# Patient Record
Sex: Male | Born: 1985 | Race: White | Hispanic: No | Smoking: Never smoker
Health system: Southern US, Community
[De-identification: ages and names within clinical notes are randomized; demographics above are authoritative.]

## PROBLEM LIST (undated history)

## (undated) DIAGNOSIS — E785 Hyperlipidemia, unspecified: Secondary | ICD-10-CM

## (undated) DIAGNOSIS — Z87898 Personal history of other specified conditions: Secondary | ICD-10-CM

## (undated) DIAGNOSIS — T7840XA Allergy, unspecified, initial encounter: Secondary | ICD-10-CM

## (undated) DIAGNOSIS — I10 Essential (primary) hypertension: Secondary | ICD-10-CM

## (undated) DIAGNOSIS — F319 Bipolar disorder, unspecified: Secondary | ICD-10-CM

## (undated) DIAGNOSIS — D649 Anemia, unspecified: Secondary | ICD-10-CM

## (undated) DIAGNOSIS — G40909 Epilepsy, unspecified, not intractable, without status epilepticus: Secondary | ICD-10-CM

## (undated) HISTORY — DX: Bipolar disorder, unspecified: F31.9

## (undated) HISTORY — DX: Personal history of other specified conditions: Z87.898

## (undated) HISTORY — PX: OTHER SURGICAL HISTORY: SHX169

## (undated) HISTORY — DX: Allergy, unspecified, initial encounter: T78.40XA

## (undated) HISTORY — DX: Anemia, unspecified: D64.9

## (undated) HISTORY — DX: Epilepsy, unspecified, not intractable, without status epilepticus: G40.909

## (undated) HISTORY — DX: Hyperlipidemia, unspecified: E78.5

## (undated) HISTORY — DX: Essential (primary) hypertension: I10

---
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
## 2010-01-02 ENCOUNTER — Emergency Department (HOSPITAL_COMMUNITY): Admission: EM | Admit: 2010-01-02 | Discharge: 2010-01-02 | Payer: Self-pay | Admitting: Emergency Medicine

## 2010-02-10 ENCOUNTER — Emergency Department (HOSPITAL_COMMUNITY): Admission: EM | Admit: 2010-02-10 | Discharge: 2010-02-10 | Payer: Self-pay | Admitting: Emergency Medicine

## 2010-10-04 ENCOUNTER — Emergency Department (HOSPITAL_COMMUNITY)
Admission: EM | Admit: 2010-10-04 | Discharge: 2010-10-04 | Payer: Self-pay | Source: Home / Self Care | Admitting: Emergency Medicine

## 2010-11-06 ENCOUNTER — Emergency Department (HOSPITAL_COMMUNITY)
Admission: EM | Admit: 2010-11-06 | Discharge: 2010-11-06 | Payer: Self-pay | Source: Home / Self Care | Admitting: Emergency Medicine

## 2013-02-18 ENCOUNTER — Encounter: Payer: Self-pay | Admitting: Family Medicine

## 2013-02-18 DIAGNOSIS — T7840XA Allergy, unspecified, initial encounter: Secondary | ICD-10-CM | POA: Insufficient documentation

## 2013-02-18 DIAGNOSIS — F319 Bipolar disorder, unspecified: Secondary | ICD-10-CM | POA: Insufficient documentation

## 2013-02-18 DIAGNOSIS — I10 Essential (primary) hypertension: Secondary | ICD-10-CM | POA: Insufficient documentation

## 2013-02-18 DIAGNOSIS — D649 Anemia, unspecified: Secondary | ICD-10-CM | POA: Insufficient documentation

## 2013-02-19 ENCOUNTER — Encounter: Payer: Self-pay | Admitting: Physician Assistant

## 2013-02-19 ENCOUNTER — Ambulatory Visit (INDEPENDENT_AMBULATORY_CARE_PROVIDER_SITE_OTHER): Payer: Medicaid Other | Admitting: Physician Assistant

## 2013-02-19 VITALS — BP 126/90 | HR 80 | Temp 98.4°F | Resp 18 | Ht 74.0 in | Wt 218.0 lb

## 2013-02-19 DIAGNOSIS — F319 Bipolar disorder, unspecified: Secondary | ICD-10-CM

## 2013-02-19 DIAGNOSIS — L02212 Cutaneous abscess of back [any part, except buttock]: Secondary | ICD-10-CM

## 2013-02-19 DIAGNOSIS — L03319 Cellulitis of trunk, unspecified: Secondary | ICD-10-CM

## 2013-02-19 MED ORDER — DOXYCYCLINE HYCLATE 100 MG PO TABS: 100.0000 mg | ORAL_TABLET | Freq: Two times a day (BID) | ORAL | Status: DC

## 2013-02-19 MED ORDER — QUETIAPINE FUMARATE 50 MG PO TABS: 50.0000 mg | ORAL_TABLET | Freq: Two times a day (BID) | ORAL | Status: DC

## 2013-02-19 MED ORDER — SULFAMETHOXAZOLE-TRIMETHOPRIM 800-160 MG PO TABS: 1.0000 | ORAL_TABLET | Freq: Two times a day (BID) | ORAL | Status: DC

## 2013-02-19 NOTE — Progress Notes (Signed)
Patient ID: Samuel Reynolds MRN: [id number], DOB: [birth year], 27 y.o. Date of Encounter: @DATE @  Chief Complaint:  Chief Complaint  Patient presents with  . draining spot at lower back at buttocks crack  . Medication Problem    states off all meds sicne last year because pharmacy says we never refilled?????    HPI: 27 y.o. year old male  presents with:  1- first noticed this spot-top of buttock crease-3 days ago. Noticed some red, yellow drainage.  Has never had abscess on any area of skin before this per pt. No fever/chills.  2- Has been off all meds since 03/2012. Says pharmacy "wouldn't refill"   Bipolar; Says he has noticed big difference since off meds. "Alot of mood swings and very snappy very easily." No suicidal,homicidal ideations.   Past Medical History  Diagnosis Date  . Allergy     seasonal  . Bipolar 1 disorder   . Anemia   . Hypertension      Home Meds: No current outpatient prescriptions on file prior to visit.   No current facility-administered medications on file prior to visit.    Allergies:  Allergies  Allergen Reactions  . Benadryl (Diphenhydramine Hcl)     History   Social History  . Marital Status: Married    Spouse Name: N/A    Number of Children: N/A  . Years of Education: N/A   Occupational History  . Not on file.   Social History Main Topics  . Smoking status: Never Smoker   . Smokeless tobacco: Never Used  . Alcohol Use: No  . Drug Use: No  . Sexually Active: Not on file   Other Topics Concern  . Not on file   Social History Narrative  . No narrative on file    No family history on file.   Review of Systems: Constitutional: negative for chills, fever, night sweats, weight changes, or fatigue  HEENT: negative for vision changes, hearing loss, congestion, rhinorrhea, ST, epistaxis, or sinus pressure Cardiovascular: negative for chest pain or palpitations. No new/increased shortness of breath or dyspnea on  exertion Respiratory: negative for hemoptysis, wheezing, shortness of breath, or cough Abdominal: negative for abdominal pain, nausea, vomiting, diarrhea, or constipation Dermatological: See HPI. O/W negative Neurologic: negative for headache, dizziness, or syncope All other systems reviewed and are otherwise negative with the exception to those above and in the HPI.   Physical Exam: Blood pressure 126/90, pulse 80, temperature 98.4 F (36.9 C), temperature source Oral, resp. rate 18, height 6\' 2"  (1.88 m), weight 218 lb (98.884 kg)., Body mass index is 27.98 kg/(m^2). General: Well developed, well nourished,WM. Appears in no acute distress. Lungs: Clear bilaterally to auscultation without wheezes, rales, or rhonchi. Breathing is unlabored. Heart: RRR with S1 S2. No murmurs, rubs, or gallops. Musculoskeletal:  Strength and tone normal for age. Extremities/Skin: Warm and dry. Low back, at top of crease of buttocks, on right side, there is a 1cm area of erythema. It is open and draining- purulent yellow-green, followed by bloody drainage. Culture obtained. Has already been draining. Now very tiny area of firmness/abscess. Exam not c/w pilonidal cyst Neuro: Alert and oriented X 3. Moves all extremities spontaneously. Gait is normal. CNII-XII grossly in tact. Psych:  Responds to questions appropriately with a normal affect. Stable/Appropriate during visit.     ASSESSMENT AND PLAN:  27 y.o. year old male with  1. Abscess of lower back (not c/w pilonidal cyst) Has already drained. Does not require I&D -  Culture, routine-abscess - sulfamethoxazole-trimethoprim (BACTRIM DS,SEPTRA DS) 800-160 MG per tablet; Take 1 tablet by mouth 2 (two) times daily.  Dispense: 20 tablet; Refill: 0 - doxycycline (VIBRA-TABS) 100 MG tablet; Take 1 tablet (100 mg total) by mouth 2 (two) times daily.  Dispense: 20 tablet; Refill: 0  2. Bipolar disorder, unspecified Explained to pt to never stop his meds again. If  needs refills and pharmacy will not refill, call us. Will restart Seroquel now. He is to schedule F/U OV with Dr Tanya Nones to f/u this in 3 weeks (before med runs out) - QUEtiapine (SEROQUEL) 50 MG tablet; Take 1 tablet (50 mg total) by mouth 2 (two) times daily.  Dispense: 60 tablet; Refill: 0   Signed, [address] [place], [place], [facility] 02/19/2013 11:52 AM

## 2013-02-21 LAB — CULTURE, ROUTINE-ABSCESS
Gram Stain: NONE SEEN
Gram Stain: NONE SEEN
Gram Stain: NONE SEEN

## 2013-03-12 ENCOUNTER — Encounter: Payer: Self-pay | Admitting: Family Medicine

## 2013-03-12 ENCOUNTER — Ambulatory Visit (INDEPENDENT_AMBULATORY_CARE_PROVIDER_SITE_OTHER): Payer: Medicaid Other | Admitting: Family Medicine

## 2013-03-12 VITALS — BP 128/70 | HR 86 | Temp 98.0°F | Resp 16 | Wt 218.0 lb

## 2013-03-12 DIAGNOSIS — F319 Bipolar disorder, unspecified: Secondary | ICD-10-CM

## 2013-03-12 DIAGNOSIS — L0501 Pilonidal cyst with abscess: Secondary | ICD-10-CM

## 2013-03-12 MED ORDER — PANTOPRAZOLE SODIUM 40 MG PO TBEC: 40.0000 mg | DELAYED_RELEASE_TABLET | Freq: Every day | ORAL | Status: DC

## 2013-03-12 MED ORDER — DIVALPROEX SODIUM 500 MG PO DR TAB: 500.0000 mg | DELAYED_RELEASE_TABLET | Freq: Two times a day (BID) | ORAL | Status: DC

## 2013-03-12 NOTE — Progress Notes (Signed)
Subjective:    Patient ID: Samuel Reynolds, male    DOB: 06/10/1986, 27 y.o.   MRN: [id number]  HPI Patient is here for followup of his last office visit. He originally saw Lawson Fiscal for an abscess at the superior aspect of his gluteal cleft. His culture revealed Streptococcus agalactiae and the abscess has since completely resolved on antibiotics. However the location raises concern for possibly a pilonidal cyst. There is no residual abscess or cyst for examination. There is a small 4 mm red macule where the accessory joint was located at the superior aspect of his gluteal cleft. He also was resumed his medicines for bipolar. He previously was well controlled on Depakote 500 mg by mouth twice a day. We originally added Seroquel 50 mg by mouth twice a day for anger control. He's been off medications for over 7 months. He denies any depression, suicidal ideation, or manic symptoms. He does report problems with wall mood swings. He's also having frequent episodes of indigestion. Past Medical History  Diagnosis Date  . Allergy     seasonal  . Bipolar 1 disorder   . Anemia   . Hypertension    Current Outpatient Prescriptions on File Prior to Visit  Medication Sig Dispense Refill  . clonazePAM (KLONOPIN) 0.5 MG tablet Take 0.5 mg by mouth 2 (two) times daily as needed for anxiety.      Marland Kitchen doxycycline (VIBRA-TABS) 100 MG tablet Take 1 tablet (100 mg total) by mouth 2 (two) times daily.  20 tablet  0  . sulfamethoxazole-trimethoprim (BACTRIM DS,SEPTRA DS) 800-160 MG per tablet Take 1 tablet by mouth 2 (two) times daily.  20 tablet  0  . hydrochlorothiazide (MICROZIDE) 12.5 MG capsule Take 12.5 mg by mouth daily.      . meloxicam (MOBIC) 15 MG tablet Take 15 mg by mouth daily.      . QUEtiapine (SEROQUEL) 50 MG tablet Take 1 tablet (50 mg total) by mouth 2 (two) times daily.  60 tablet  0  . topiramate (TOPAMAX) 25 MG tablet Take 25 mg by mouth 2 (two) times daily. 25mg  qhs x 1 week then, two at  bedtime x 1 week, then start 100mg  qhs there after       No current facility-administered medications on file prior to visit.   Allergies  Allergen Reactions  . Benadryl (Diphenhydramine Hcl)       Review of Systems  All other systems reviewed and are negative.       Objective:   Physical Exam  Vitals reviewed. Constitutional: He appears well-developed and well-nourished.  Cardiovascular: Normal rate, normal heart sounds and intact distal pulses.   Pulmonary/Chest: Effort normal and breath sounds normal.  Abdominal: Soft. Bowel sounds are normal.  Skin: Skin is warm. No rash noted. No erythema. No pallor.  Psychiatric: He has a normal mood and affect. His behavior is normal. Judgment and thought content normal.   See description above       Assessment & Plan:  1. Bipolar disorder, unspecified Resume Depakote 500 mg by mouth twice a day. I will recheck the patient in 6 weeks to see how he is doing. I like to refrain from using Seroquel if at allpossible due to side effect profile.  I will also refill his protonic stooges history of GERD - pantoprazole (PROTONIX) 40 MG tablet; Take 1 tablet (40 mg total) by mouth daily.  Dispense: 30 tablet; Refill: 5 - divalproex (DEPAKOTE) 500 MG DR tablet; Take 1 tablet (500  mg total) by mouth 2 (two) times daily.  Dispense: 60 tablet; Refill: 2  2. Pilonidal abscess Exam is essentially normal today. However his history and location of abscess sounds like a pilonidal cyst disease.  I explained to the patient if this becomes recurrent he would likely benefit from seeing a surgeon for complete cyst excision.

## 2013-04-27 ENCOUNTER — Ambulatory Visit (INDEPENDENT_AMBULATORY_CARE_PROVIDER_SITE_OTHER): Payer: Medicaid Other | Admitting: Family Medicine

## 2013-04-27 ENCOUNTER — Encounter: Payer: Self-pay | Admitting: Family Medicine

## 2013-04-27 VITALS — BP 128/74 | HR 96 | Temp 98.1°F | Resp 18 | Ht 73.0 in | Wt 222.0 lb

## 2013-04-27 DIAGNOSIS — F319 Bipolar disorder, unspecified: Secondary | ICD-10-CM

## 2013-04-27 DIAGNOSIS — Z79899 Other long term (current) drug therapy: Secondary | ICD-10-CM

## 2013-04-27 DIAGNOSIS — R079 Chest pain, unspecified: Secondary | ICD-10-CM

## 2013-04-27 LAB — CBC WITH DIFFERENTIAL/PLATELET
Basophils Absolute: 0 10*3/uL (ref 0.0–0.1)
Basophils Relative: 0 % (ref 0–1)
Eosinophils Absolute: 0.2 10*3/uL (ref 0.0–0.7)
Eosinophils Relative: 4 % (ref 0–5)
Lymphs Abs: 1.9 10*3/uL (ref 0.7–4.0)
MCH: 29.8 pg (ref 26.0–34.0)
MCHC: 34.9 g/dL (ref 30.0–36.0)
MCV: 85.3 fL (ref 78.0–100.0)
Neutrophils Relative %: 53 % (ref 43–77)
Platelets: 189 10*3/uL (ref 150–400)
RBC: 4.57 MIL/uL (ref 4.22–5.81)
RDW: 14.2 % (ref 11.5–15.5)

## 2013-04-27 LAB — LIPID PANEL
Cholesterol: 196 mg/dL (ref 0–200)
HDL: 44 mg/dL (ref >39)
Total CHOL/HDL Ratio: 4.5 Ratio
VLDL: 14 mg/dL (ref 0–40)

## 2013-04-27 LAB — COMPLETE METABOLIC PANEL WITH GFR
AST: 20 U/L (ref 0–37)
Albumin: 5.2 g/dL (ref 3.5–5.2)
Alkaline Phosphatase: 68 U/L (ref 39–117)
BUN: 14 mg/dL (ref 6–23)
Potassium: 4.6 mEq/L (ref 3.5–5.3)
Sodium: 140 mEq/L (ref 135–145)
Total Bilirubin: 0.5 mg/dL (ref 0.3–1.2)

## 2013-04-27 MED ORDER — ARIPIPRAZOLE 5 MG PO TABS: 5.0000 mg | ORAL_TABLET | Freq: Every day | ORAL | Status: DC

## 2013-04-27 MED ORDER — PANTOPRAZOLE SODIUM 40 MG PO TBEC: 40.0000 mg | DELAYED_RELEASE_TABLET | Freq: Two times a day (BID) | ORAL | Status: DC

## 2013-04-27 NOTE — Progress Notes (Signed)
Subjective:    Patient ID: Samuel Reynolds, male    DOB: September 04, 1986, 27 y.o.   MRN: [id number]  Anxiety    03/12/13 Patient is here for followup of his last office visit. He originally saw Frazier Richards for an abscess at the superior aspect of his gluteal cleft. His culture revealed Streptococcus agalactiae and the abscess has since completely resolved on antibiotics. However the location raises concern for possibly a pilonidal cyst. There is no residual abscess or cyst for examination. There is a small 4 mm red macule where the I+D site was located at the superior aspect of his gluteal cleft. He also wants to resume his medicines for bipolar. He previously was well controlled on Depakote 500 mg by mouth twice a day. We originally added Seroquel 50 mg by mouth twice a day for anger control. He's been off medications for over 7 months. He denies any depression, suicidal ideation, or manic symptoms. He does report problems with wild mood swings. He's also having frequent episodes of indigestion.  At that time, I diagnosed him with: 1. Bipolar disorder, unspecified Resume Depakote 500 mg by mouth twice a day. I will recheck the patient in 6 weeks to see how he is doing. I like to refrain from using Seroquel if at all possible due to side effect profile.  I will also refill his protonix due to his history of GERD - pantoprazole (PROTONIX) 40 MG tablet; Take 1 tablet (40 mg total) by mouth daily.  Dispense: 30 tablet; Refill: 5 - divalproex (DEPAKOTE) 500 MG DR tablet; Take 1 tablet (500 mg total) by mouth 2 (two) times daily.  Dispense: 60 tablet; Refill: 2  04/27/13 Patient states that the depakote has not helped at all.  He continues to frequently and easily lose his temper for no reason.  He also as severe insomnia.  He lays in bed to 3-4 AM every night unable to sleep because his thoughts keep racing through his head.  He has a hard time shutting off his mind at night.  He denies depression.  He was  originally diagnosed with bipolar by Dr. Noelle Penner (his former psychiatrist).  He denies any impulsive spending sprees, sexual indiscretion, or wild unusual behavior.  His main problem of impulse control is managing his wild temper and mood swings.  He also reports an episode of substernal chest discomfort that woke him from sleep 04/21/13.  He states his left arm went numb but it resolved spontaneously.  It lasted for several minutes. He denies angina but does report significant dyspnea on exertion.  He has a history of borderline BP in past but it is well controlled.  He also has a significant FH of premature CAD. He also has refractory GERD.  He awakes every morning with dyspepsia and nausea.  Protonix has helped only some.            Past Medical History  Diagnosis Date  . Allergy     seasonal  . Bipolar 1 disorder   . Anemia   . Hypertension    Current Outpatient Prescriptions on File Prior to Visit  Medication Sig Dispense Refill  . divalproex (DEPAKOTE) 500 MG DR tablet Take 1 tablet (500 mg total) by mouth 2 (two) times daily.  60 tablet  2  . pantoprazole (PROTONIX) 40 MG tablet Take 1 tablet (40 mg total) by mouth daily.  30 tablet  5   No current facility-administered medications on file prior to visit.  Allergies  Allergen Reactions  . Benadryl (Diphenhydramine Hcl)    Family History  Problem Relation Age of Onset  . Heart disease Father     PTCA at 47/CAD  . Heart disease Paternal Grandfather     MI at 33   History   Social History  . Marital Status: Married    Spouse Name: N/A    Number of Children: N/A  . Years of Education: N/A   Occupational History  . Not on file.   Social History Main Topics  . Smoking status: Never Smoker   . Smokeless tobacco: Never Used  . Alcohol Use: No  . Drug Use: No  . Sexually Active: Not on file   Other Topics Concern  . Not on file   Social History Narrative  . No narrative on file      Review of Systems  All other  systems reviewed and are negative.       Objective:   Physical Exam  Vitals reviewed. Constitutional: He appears well-developed and well-nourished.  Cardiovascular: Normal rate, normal heart sounds and intact distal pulses.   Pulmonary/Chest: Effort normal and breath sounds normal.  Abdominal: Soft. Bowel sounds are normal.  Skin: Skin is warm. No rash noted. No erythema. No pallor.  Psychiatric: He has a normal mood and affect. His behavior is normal. Judgment and thought content normal.   EKG shows normal sinus rhythm with normal intervals and a normal axis and no evidence of ischemia or infarction.       Assessment & Plan:  1. Bipolar disorder, unspecified D/C depakote and start abilify 5 mg poqhs.  Recheck in 1 month. - COMPLETE METABOLIC PANEL WITH GFR - Lipid panel - CBC with Differential - ARIPiprazole (ABILIFY) 5 MG tablet; Take 1 tablet (5 mg total) by mouth daily.  Dispense: 30 tablet; Refill: 3 - pantoprazole (PROTONIX) 40 MG tablet; Take 1 tablet (40 mg total) by mouth 2 (two) times daily.  Dispense: 30 tablet; Refill: 5  2. High risk medication use - COMPLETE METABOLIC PANEL WITH GFR - Lipid panel - CBC with Differential  3. Chest pain, unspecified I feel this is likely related to a GI source given his refractory GERD or to an anxiety attack.  However, he is adamant he is having severe dyspnea on exertion.  That coupled with his FH of premature CVD warrants a cardiology eval.  Meanwhile, increase protonix to 40 mg pobid and recheck in 1 month.  Hopefully, the change in his mood stabilizer and his increased PPI will prevent further chest pain.  If persistant, get CXR. - EKG 12-Lead - Ambulatory referral to Cardiology

## 2013-05-01 ENCOUNTER — Encounter: Payer: Self-pay | Admitting: Physician Assistant

## 2013-05-13 ENCOUNTER — Ambulatory Visit: Payer: Self-pay | Admitting: Cardiology

## 2013-05-22 ENCOUNTER — Encounter: Payer: Self-pay | Admitting: Cardiology

## 2013-05-22 ENCOUNTER — Ambulatory Visit (INDEPENDENT_AMBULATORY_CARE_PROVIDER_SITE_OTHER): Payer: Medicaid Other | Admitting: Cardiology

## 2013-05-22 VITALS — BP 132/80 | HR 73 | Ht 76.2 in | Wt 223.0 lb

## 2013-05-22 DIAGNOSIS — R6889 Other general symptoms and signs: Secondary | ICD-10-CM

## 2013-05-22 DIAGNOSIS — R000 Tachycardia, unspecified: Secondary | ICD-10-CM

## 2013-05-22 DIAGNOSIS — R55 Syncope and collapse: Secondary | ICD-10-CM | POA: Insufficient documentation

## 2013-05-22 DIAGNOSIS — R42 Dizziness and giddiness: Secondary | ICD-10-CM | POA: Insufficient documentation

## 2013-05-22 DIAGNOSIS — I10 Essential (primary) hypertension: Secondary | ICD-10-CM

## 2013-05-22 DIAGNOSIS — R0789 Other chest pain: Secondary | ICD-10-CM

## 2013-05-22 NOTE — Patient Instructions (Addendum)
Because figured out what he may be having.  I talked about you probably have a baseline condition called a hyperactive vasovagal response, this could explain your passing out as a teenager and the sensation of passing out with blood draws. Either compounding this by not eating and only drinking sodas.  This could is not enough volume of fluid in your bloodstream to maintain your blood pressure when you stand up and around, your heart has a than work harder to be faster.  Just to make sure there is not a true abnormal rhythm of your heart, I will have you wear a monitor for 2 weeks.  For the first week I want to just do your regular routine for the second week I which to make sure you have something for breakfast with a glass of water loss of orange juice or something to drink non-caffeinated no sugar.  Unable to drink plenty of water to courses of ACE at every time you urinate is clear.  Reduce your salt drinks caffeine and sugar and replaced them with Crystal light or water.  He can't go totally off caffeine sodas, so it's okay to drink 1 or 2 of course the day.  Also to get some blood work to check your blood count and your thyroid levels.    I will see you back in about 3-4 weeks to go over the results.  Marykay Lex, MD

## 2013-05-26 LAB — TSH: TSH: 1.324 u[IU]/mL (ref 0.350–4.500)

## 2013-05-26 LAB — CBC
HCT: 39.6 % (ref 39.0–52.0)
Hemoglobin: 13.6 g/dL (ref 13.0–17.0)
RDW: 14.2 % (ref 11.5–15.5)
WBC: 6.6 10*3/uL (ref 4.0–10.5)

## 2013-05-27 ENCOUNTER — Telehealth: Payer: Self-pay | Admitting: *Deleted

## 2013-05-27 NOTE — Telephone Encounter (Signed)
Message copied by Tobin Chad on Wed May 27, 2013  9:42 AM ------      Message from: [facility], DAVID      Created: Tue May 26, 2013 10:52 AM       Normal Labs - not anemic & TSH normal.            Marykay Lex, MD       ------

## 2013-05-27 NOTE — Telephone Encounter (Signed)
Lab Result given. Spoke to patient. Verbalized understanding.

## 2013-05-27 NOTE — Telephone Encounter (Signed)
Spoke to patient earlier about lab results. He stated that he has not received cardionet monitor that was ordered. Called [employer]. Stated it was mailed on Monday,it should arrive today.-notified patient.

## 2013-05-28 ENCOUNTER — Encounter: Payer: Self-pay | Admitting: Family Medicine

## 2013-05-28 ENCOUNTER — Ambulatory Visit (INDEPENDENT_AMBULATORY_CARE_PROVIDER_SITE_OTHER): Payer: Medicaid Other | Admitting: Family Medicine

## 2013-05-28 VITALS — BP 110/70 | HR 80 | Temp 97.6°F | Resp 16 | Wt 224.0 lb

## 2013-05-28 DIAGNOSIS — F319 Bipolar disorder, unspecified: Secondary | ICD-10-CM

## 2013-05-28 MED ORDER — ARIPIPRAZOLE 10 MG PO TABS: 10.0000 mg | ORAL_TABLET | Freq: Every day | ORAL | Status: DC

## 2013-05-28 NOTE — Progress Notes (Signed)
Subjective:    Patient ID: Samuel Reynolds, male    DOB: 16-Dec-1985, 27 y.o.   MRN: [id number]  Anxiety    03/12/13 Patient is here for followup of his last office visit. He originally saw Frazier Richards for an abscess at the superior aspect of his gluteal cleft. His culture revealed Streptococcus agalactiae and the abscess has since completely resolved on antibiotics. However the location raises concern for possibly a pilonidal cyst. There is no residual abscess or cyst for examination. There is a small 4 mm red macule where the I+D site was located at the superior aspect of his gluteal cleft. He also wants to resume his medicines for bipolar. He previously was well controlled on Depakote 500 mg by mouth twice a day. We originally added Seroquel 50 mg by mouth twice a day for anger control. He's been off medications for over 7 months. He denies any depression, suicidal ideation, or manic symptoms. He does report problems with wild mood swings. He's also having frequent episodes of indigestion.  At that time, I diagnosed him with: 1. Bipolar disorder, unspecified Resume Depakote 500 mg by mouth twice a day. I will recheck the patient in 6 weeks to see how he is doing. I like to refrain from using Seroquel if at all possible due to side effect profile.  I will also refill his protonix due to his history of GERD - pantoprazole (PROTONIX) 40 MG tablet; Take 1 tablet (40 mg total) by mouth daily.  Dispense: 30 tablet; Refill: 5 - divalproex (DEPAKOTE) 500 MG DR tablet; Take 1 tablet (500 mg total) by mouth 2 (two) times daily.  Dispense: 60 tablet; Refill: 2  04/27/13 Patient states that the depakote has not helped at all.  He continues to frequently and easily lose his temper for no reason.  He also as severe insomnia.  He lays in bed to 3-4 AM every night unable to sleep because his thoughts keep racing through his head.  He has a hard time shutting off his mind at night.  He denies depression.  He was  originally diagnosed with bipolar by Dr. Noelle Penner (his former psychiatrist).  He denies any impulsive spending sprees, sexual indiscretion, or wild unusual behavior.  His main problem of impulse control is managing his wild temper and mood swings.  He also reports an episode of substernal chest discomfort that woke him from sleep 04/21/13.  He states his left arm went numb but it resolved spontaneously.  It lasted for several minutes. He denies angina but does report significant dyspnea on exertion.  He has a history of borderline BP in past but it is well controlled.  He also has a significant FH of premature CAD. He also has refractory GERD.  He awakes every morning with dyspepsia and nausea.  Protonix has helped only some. At that time, my plan was: 1. Bipolar disorder, unspecified D/C depakote and start abilify 5 mg poqhs.  Recheck in 1 month. - COMPLETE METABOLIC PANEL WITH GFR - Lipid panel - CBC with Differential - ARIPiprazole (ABILIFY) 5 MG tablet; Take 1 tablet (5 mg total) by mouth daily.  Dispense: 30 tablet; Refill: 3 - pantoprazole (PROTONIX) 40 MG tablet; Take 1 tablet (40 mg total) by mouth 2 (two) times daily.  Dispense: 30 tablet; Refill: 5  2. High risk medication use - COMPLETE METABOLIC PANEL WITH GFR - Lipid panel - CBC with Differential  3. Chest pain, unspecified I feel this is likely related to a  GI source given his refractory GERD or to an anxiety attack.  However, he is adamant he is having severe dyspnea on exertion.  That coupled with his FH of premature CVD warrants a cardiology eval.  Meanwhile, increase protonix to 40 mg pobid and recheck in 1 month.  Hopefully, the change in his mood stabilizer and his increased PPI will prevent further chest pain.  If persistant, get CXR. - EKG 12-Lead - Ambulatory referral to Cardiology  05/28/13 Patient thinks the Abilify is helping. He reports less depression. He has less anxiety. He is still not sleeping well at night. He goes  to bed at 3 AM and wakes up at 8 AM. He feels very tired. His weight is up 2 pounds. His wife thinks the medicine is really helping him. He is interested in increasing the Abilify because he continues to have mood swings. It takes him longer now to lose his temper but when he does he continues to have violent outbursts and issues with rage control.  Patient blames a lot of this on his social situation. He is currently living with his in-laws. His father-in-law has accused him of stealing. This has created several confrontations and arguments in the family. He also does not get along with the family very well. His wife is currently working and is pregnant. He is currently looking for a job. If he can find a job they are planning to move out.           Past Medical History  Diagnosis Date  . Allergy     seasonal  . Bipolar 1 disorder   . Anemia   . Hypertension    Current Outpatient Prescriptions on File Prior to Visit  Medication Sig Dispense Refill  . ARIPiprazole (ABILIFY) 5 MG tablet Take 1 tablet (5 mg total) by mouth daily.  30 tablet  3  . pantoprazole (PROTONIX) 40 MG tablet Take 1 tablet (40 mg total) by mouth 2 (two) times daily.  30 tablet  5   No current facility-administered medications on file prior to visit.   Allergies  Allergen Reactions  . Benadryl (Diphenhydramine Hcl)   . Hydrocodone Hives and Nausea And Vomiting   Family History  Problem Relation Age of Onset  . Heart disease Father     PTCA at 47/CAD  . Heart disease Paternal Grandfather     MI at 75   History   Social History  . Marital Status: Married    Spouse Name: N/A    Number of Children: N/A  . Years of Education: N/A   Occupational History  . Not on file.   Social History Main Topics  . Smoking status: Never Smoker   . Smokeless tobacco: Never Used  . Alcohol Use: No  . Drug Use: No  . Sexually Active: Not on file   Other Topics Concern  . Not on file   Social History Narrative  . No  narrative on file      Review of Systems  All other systems reviewed and are negative.       Objective:   Physical Exam  Vitals reviewed. Constitutional: He appears well-developed and well-nourished.  Cardiovascular: Normal rate, normal heart sounds and intact distal pulses.   Pulmonary/Chest: Effort normal and breath sounds normal.  Abdominal: Soft. Bowel sounds are normal.  Skin: Skin is warm. No rash noted. No erythema. No pallor.  Psychiatric: He has a normal mood and affect. His behavior is normal. Judgment and thought  content normal.         Assessment & Plan:   1. Bipolar disorder, unspecified Increase Abilify to 10 mg by mouth each bedtime. Recheck in one month. I would not push the dose of this medicine further. I recommended couples counseling to the patient and his wife. Also recommended that he move from the home in which they're living as this seems to be a cause of a lot of social stress which I believe is exacerbating his anger control. - ARIPiprazole (ABILIFY) 10 MG tablet; Take 1 tablet (10 mg total) by mouth daily.  Dispense: 30 tablet; Refill: 3

## 2013-06-07 ENCOUNTER — Encounter: Payer: Self-pay | Admitting: Cardiology

## 2013-06-07 DIAGNOSIS — R0789 Other chest pain: Secondary | ICD-10-CM | POA: Insufficient documentation

## 2013-06-07 NOTE — Assessment & Plan Note (Addendum)
Based on what he described in childhood and during blood draws, he clearly has a tendency to hyperactive vagal tone. This tendency would make him more susceptible to adverse response to dehydration and caffeine.  Discussed at isometric exercise to help combat vasovagal syncope: leaning back against the wall with your feet slightly away from the wall roughly 1 foot) and knees slightly bent.  Hold yourself this way for 5 minutes at a time a couple times in the morning, afternoon and evening.  This trains your muscles to a different response in the used to.  This will increase the blood flow back to you had on your standing.

## 2013-06-07 NOTE — Assessment & Plan Note (Signed)
Not sure what to make of that one episode during which he woke up feeling comfortable. He has more episodes, we discussed the possibility of a cardiopulmonary exercise test.

## 2013-06-07 NOTE — Progress Notes (Signed)
Patient ID: Samuel Reynolds, male   DOB: 07/03/1986, 27 y.o.   MRN: [id number]  PCP: Samuel Richards, PA-C  Clinic Note: Chief Complaint  Patient presents with  . New Evaluation    sob with small amount of moment,heart racing,some chest discomfort when extremely active,no edema    HPI: Samuel Reynolds is a 27 y.o. male with a PMH below who presents today for baseline cardiac evaluation for chest discomfort. He was seen by Dr. Tanya Reynolds for reevaluation of what amounted to be a pilonidal cyst, and for adjustment of his medications for bipolar disorder. During that visit, he noted an episode of chest discomfort that awoke him from sleep on July 1. He says his left arm went numb. This all resolved spontaneously after several minutes. He did never had any episodes of chest discomfort prior to this but has noted dyspnea. It somewhat confusing, because the notes from primary care physician's office note premature coronary disease in his father and his paternal grandfather, he did not confirm on his intake patient information form  Interval History: He presents today noting that he had that one episode over a month ago with no recurrent symptoms. He's been active since and has not had symptoms since then. When he does note that over last couple months he's been noticing decreased exercise tolerance wall walking around [employer]. He describes it as feeling unsteady shortness of breath as if his heart rate shoots up and physical going very fast. This may feel lightheaded and short of breath. He feels like he may come close to passing out, has not actually passed out. This happens mostly in the morning. He describes his having the sensation of feeling hot flushed sensation and feeling closed in. He denies any chest tightness or discomfort associated with it. Besides these episodes, he also notes his heart rate will go up at rest if he has any sudden movements. He also notes some orthostatic dizziness but nothing has  made it feel like passing out. He denies any PND orthopnea or edema. As far as his dietary pattern goes, he does not eat breakfast occasionally will have lunch and a small dinner. The course of the day he eats lots of sodas as snacks. He drinks point cavity of sodas including Sun Drop, Diet [facility] and Dr. Reino Kent.   He does recall having episodes of passing out as a teenager, and does describe symptoms of vasovagal syncope during blood draws or needle sticks. He denies any melena, hematochezia or hematuria. He didn't loose stool this morning but with no blood.  Past Medical History  Diagnosis Date  . Allergy     seasonal  . Bipolar 1 disorder   . Anemia   . Hypertension   . Seizure disorder 1994, 2001  . Dyslipidemia     Per patient report  . H/O syncope     Since teenager. Also vasovagal with blood draws.   Prior Cardiac Evaluation and Past Surgical History: None History reviewed. No pertinent past surgical history.  Allergies  Allergen Reactions  . Benadryl [Diphenhydramine Hcl]   . Hydrocodone Hives and Nausea And Vomiting    Current Outpatient Prescriptions  Medication Sig Dispense Refill  . pantoprazole (PROTONIX) 40 MG tablet Take 1 tablet (40 mg total) by mouth 2 (two) times daily.  30 tablet  5  . ARIPiprazole (ABILIFY) 10 MG tablet Take 1 tablet (10 mg total) by mouth daily.  30 tablet  3   No current facility-administered medications for this  visit.   History   Social History Narrative   Married father of one. Lives with his wife and daughter as well as his mother-in-law and sister-in-law and nephew. He drinks occasional alcohol, but never smoked or use of tobacco products. Does not exercise because he "does not feel like he has the time her energy.   Family History  Problem Relation Age of Onset  . Hypertension Mother   . Hyperlipidemia Mother   . Hyperlipidemia Father   . Hypertension Father   . Cancer Mother   . Hyperlipidemia Maternal Grandmother   .  Hypertension Maternal Grandmother   . Diabetes Maternal Grandmother   . Hyperlipidemia Maternal Grandfather   . Hypertension Maternal Grandfather   . Hypertension Paternal Grandmother   . Hyperlipidemia Paternal Grandmother   . Stroke Maternal Grandfather   . Cancer Paternal Grandmother   . Sudden death Paternal Grandfather   . Hypertension Paternal Grandfather   . Hyperlipidemia Paternal Grandfather   . Hyperlipidemia Sister   . Hypertension Sister   . Heart disease      Reportedly his father had PCI at age 23, this is not confirmed on the patient's report  Heart disease: Father ; PTCA at 47/CAD; Paternal Grandfather MI at 28   ROS: A comprehensive Review of Systems - Negative except Pertinent positives noted above. Also symptoms in a below. Psychological ROS: No recent manic episodes or significant depression. Endocrine ROS: positive for - temperature intolerance Gastrointestinal ROS: positive for - gas/bloating, heartburn and One episode of loose stools this morning.  PHYSICAL EXAM BP 132/80  Pulse 73  Ht 6' 4.2" (1.935 m)  Wt 223 lb (101.152 kg)  BMI 27.02 kg/m2 General appearance: alert, cooperative, appears stated age, no distress and anxious appearing but otherwise healthy, normal mood and affect. HEENT: [place]/AT, EOMI, MMM, anicteric sclera Neck: no adenopathy, no carotid bruit, no JVD, supple, symmetrical, trachea midline and thyroid not enlarged, symmetric, no tenderness/mass/nodules Lungs: clear to auscultation bilaterally, normal percussion bilaterally and nonlabored, good air movement Heart: regular rate and rhythm, S1, S2 normal, no murmur, click, rub or gallop and normal apical impulse Abdomen: soft, non-tender; bowel sounds normal; no masses,  no organomegaly Extremities: extremities normal, atraumatic, no cyanosis or edema Pulses: 2+ and symmetric Skin: Skin color, texture, turgor normal. No rashes or lesions Neurologic: Alert and oriented X 3, normal strength and  tone. Normal symmetric reflexes. Normal coordination and gait  AOZ:HYQMVHQIO today: Yes Rate: 73 , Rhythm: Normal sinus rhythm, repolarization abnormality;   Recent Labs: none   ASSESSMENT / PLAN:   Rapid heart rate His episodes are very difficult to understand. They don't sound like true arrhythmias. It sounds like it's simply sinus tachycardia versus PVCs. These are certainly exacerbated by his lack of steady diet and excessive caffeinated beverage intake.  Plan: CardioNet monitor for 2 weeks. First week will be with routine activities. Second week instructions include: Ensuring that he eats her breakfast, it is showing that he drinks plenty of non-caffeinated /non-soda or sugary beverages -- enough to ensure that when he urinates it is clear.    Vasovagal reaction Based on what he described in childhood and during blood draws, he clearly has a tendency to hyperactive vagal tone. This tendency would make him more susceptible to adverse response to dehydration and caffeine.  Discussed at isometric exercise to help combat vasovagal syncope: leaning back against the wall with your feet slightly away from the wall roughly 1 foot) and knees slightly bent.  Hold yourself  this way for 5 minutes at a time a couple times in the morning, afternoon and evening.  This trains your muscles to a different response in the used to.  This will increase the blood flow back to you had on your standing.  Hypertension On no medications and seems stable.  Cold intolerance Check TSH and CBC.  Chest discomfort Not sure what to make of that one episode during which he woke up feeling comfortable. He has more episodes, we discussed the possibility of a cardiopulmonary exercise test.  Dizziness  I think this is clearly related to his vasovagal tendencies as well as poor dietary intake and excessive caffeinated beverage intake. May also have something to do with him his medications he is on.    Orders  Placed This Encounter  Procedures  . TSH  . CBC  . EKG 12-Lead  . Cardiac event monitor    Standing Status: Future     Number of Occurrences: 1     Standing Expiration Date: 05/22/2014   No orders of the defined types were placed in this encounter.   Followup: 3-4 weeks  Abisola Carrero W. Herbie Baltimore, M.D., M.S. [facility] [address] [place], [place]  [address]  [phone] Pager # [phone]

## 2013-06-07 NOTE — Assessment & Plan Note (Signed)
I think this is clearly related to his vasovagal tendencies as well as poor dietary intake and excessive caffeinated beverage intake. May also have something to do with him his medications he is on.

## 2013-06-07 NOTE — Assessment & Plan Note (Signed)
Check TSH and CBC

## 2013-06-07 NOTE — Assessment & Plan Note (Addendum)
His episodes are very difficult to understand. They don't sound like true arrhythmias. It sounds like it's simply sinus tachycardia versus PVCs. These are certainly exacerbated by his lack of steady diet and excessive caffeinated beverage intake.  Plan: CardioNet monitor for 2 weeks. First week will be with routine activities. Second week instructions include: Ensuring that he eats her breakfast, it is showing that he drinks plenty of non-caffeinated /non-soda or sugary beverages -- enough to ensure that when he urinates it is clear.

## 2013-06-07 NOTE — Assessment & Plan Note (Signed)
On no medications and seems stable.

## 2013-06-23 ENCOUNTER — Ambulatory Visit (INDEPENDENT_AMBULATORY_CARE_PROVIDER_SITE_OTHER): Payer: Medicaid Other | Admitting: Cardiology

## 2013-06-23 ENCOUNTER — Encounter: Payer: Self-pay | Admitting: Cardiology

## 2013-06-23 VITALS — BP 138/82 | HR 80 | Ht 76.5 in | Wt 226.0 lb

## 2013-06-23 DIAGNOSIS — I10 Essential (primary) hypertension: Secondary | ICD-10-CM

## 2013-06-23 DIAGNOSIS — R0789 Other chest pain: Secondary | ICD-10-CM

## 2013-06-23 DIAGNOSIS — R002 Palpitations: Secondary | ICD-10-CM

## 2013-06-23 DIAGNOSIS — R6889 Other general symptoms and signs: Secondary | ICD-10-CM

## 2013-06-23 DIAGNOSIS — R000 Tachycardia, unspecified: Secondary | ICD-10-CM

## 2013-06-23 MED ORDER — METOPROLOL TARTRATE 25 MG PO TABS: 25.0000 mg | ORAL_TABLET | Freq: Two times a day (BID) | ORAL | Status: DC

## 2013-06-23 NOTE — Patient Instructions (Signed)
Metoprolol tart. 25 mg  Take 1 tablet by mouth twice a day.  The first month take 1/2 tablet twice a day  Wear the monitor for 1 month.  Your physician wants you to follow-up 6-7 weeks. You will receive a reminder letter in the mail two months in advance. If you don't receive a letter, please call our office to schedule the follow-up appointment.

## 2013-06-24 DIAGNOSIS — R002 Palpitations: Secondary | ICD-10-CM

## 2013-06-24 DIAGNOSIS — R55 Syncope and collapse: Secondary | ICD-10-CM

## 2013-07-06 DIAGNOSIS — R000 Tachycardia, unspecified: Secondary | ICD-10-CM | POA: Insufficient documentation

## 2013-07-06 DIAGNOSIS — R002 Palpitations: Secondary | ICD-10-CM | POA: Insufficient documentation

## 2013-07-06 NOTE — Progress Notes (Signed)
PCP: Frazier Richards, PA-C  Clinic Note: Chief Complaint  Patient presents with  . Follow-up    chest pain with heavy exertion, resolves with rest, DOE, no dizziness or syncope since last office visit   HPI: Samuel Reynolds is a 27 y.o. male with a PMH below who presents today for followup visit after being seen back in early August for dyspnea on exertion in distress his comfort and heart racing/palpitations. He was supposed to have an event monitor he wore it for the last month, but after 2 to monitor his, the transmitter never worked. He is now to be refit of the monitor today. The plan was to evaluate his palpitations and then consider whether or not we did a stress test on him.  Interval History: Since last visit he still notes it is hard to be much faster than it should him sometimes he doesn't in fact unusual activity. He said he is playing with his daughter causes vertigo significant. This is almost a daily occurrence. Sometimes the heart rate gets up fast he does feel a sense of pressure and tightness in his chest this associated with dyspnea. It's making him avoid certain success and that he would usually enjoy doing. Because he worried about having the symptoms. He says the episodes are certainly worse when he had a cold not to long ago. He has not had any more episodes like the one he had back in early July with a prolonged episode of chest discomfort which was when his arm went down it lasted several minutes associated with dyspnea.  He denies any PND, orthopnea or edema. He really doesn't notice that he has dyspnea with exertion is much as it is this heart rate going fast it and taking his breath away and causing some chest discomfort. He doesn't really feel old notes syncopal or near syncopal with it he does feel a little bit lightheaded and dizzy. He has not had any recent vasovagal episodes. He denies any melena, hematochezia or hematuria. No nosebleeds. No claudication  symptoms.  He has tried to cut down some of his caffeine intake but is still bringing up a significant amount. He still has unusual eating habits with minimal breakfast and lunch of a small dinner and several snacks in between. I reminded him that this is not a very healthy routine makes it very difficult to regulate his glucose level and also probably increases like palpitations.  Past Medical History  Diagnosis Date  . Allergy     seasonal  . Bipolar 1 disorder   . Anemia   . Hypertension   . Seizure disorder 1994, 2001  . Dyslipidemia     Per patient report  . H/O syncope     Since teenager. Also vasovagal with blood draws.   Prior Cardiac Evaluation and Past Surgical History: No past surgical history on file.  Allergies  Allergen Reactions  . Benadryl [Diphenhydramine Hcl]   . Hydrocodone Hives and Nausea And Vomiting    Current Outpatient Prescriptions  Medication Sig Dispense Refill  . ARIPiprazole (ABILIFY) 10 MG tablet Take 1 tablet (10 mg total) by mouth daily.  30 tablet  3  . pantoprazole (PROTONIX) 40 MG tablet Take 1 tablet (40 mg total) by mouth 2 (two) times daily.  30 tablet  5  . metoprolol tartrate (LOPRESSOR) 25 MG tablet Take 1 tablet (25 mg total) by mouth 2 (two) times daily.  60 tablet  11   No current facility-administered medications  for this visit.   metoprolol started today  History   Social History Narrative   Married father of one. Lives with his wife and daughter as well as his mother-in-law and sister-in-law and nephew. He drinks occasional alcohol, but never smoked or use of tobacco products. Does not exercise because he "does not feel like he has the time her energy.    he has been having several minor or in and seen to encase his major problems other social problems at home with his wife. He is actually debating whether he would goes to stay with his parents or his in-laws.  ROS: A comprehensive Review of Systems - Psychological ROS: No  recent manic episodes or severe depression. Endocrine ROS: positive for - temperature intolerance Gastrointestinal ROS: positive for - gas/bloating and heartburn  PHYSICAL EXAM BP 138/82  Pulse 80  Ht 6' 4.5" (1.943 m)  Wt 226 lb (102.513 kg)  BMI 27.15 kg/m2 General appearance: alert, cooperative, appears stated age and no distress Neck: no adenopathy, no carotid bruit and no JVD Lungs: clear to auscultation bilaterally, normal percussion bilaterally and Nonlabored, good air movement Heart: regular rate and rhythm, S1, S2 normal, no murmur, click, rub or gallop and normal apical impulse Abdomen: soft, non-tender; bowel sounds normal; no masses,  no organomegaly Extremities: extremities normal, atraumatic, no cyanosis or edema Pulses: 2+ and symmetric Neurologic: Grossly normal  AOZ:HYQMVHQIO today: No  Recent Labs: CBC from 05/26/2023: WBC 6.6, hemoglobin 13.6/hematocrit are 9.6, platelets 213. TSH 1.324  ASSESSMENT / PLAN: Palpitations He certainly has symptoms that would suggest maybe inappropriate tachycardia. I do not have him wear the monitor in order to get some data. Placed in the monitor on today. We have had him tested CardioNet to insure that the patient is not charged for an eczema since the monitors were nonfunctional.  Am reluctant to treat anything until I see what is I am treating, but because he is so symptomatic I do think that putting her on low-dose beta blocker will be okay. Start with 12.5 twice a day and bring it up over the month to 25 twice a day.  Tachycardia The question is is this tachycardia sinus tachycardia versus an arrhythmia.  Plant: CardioNet Event Monitor  Hypertension Borderline control. Beta blocker would potentially help bring down into normal range.  Chest discomfort He cannot be sure for certain that his discomfort he had excessive episodes of months ago is not due to the potential of coronary disease. He does not have many risk  factors.  I do a CLO test on treadmill. He's not sure that he will be able to walk very far on treadmill stating something about some arthritic pains are pretty hurt ankle. Just to be sure that we get a accurate test ordered this is a Myoview which was thought of trying a treadmill and if need be switch it to a LexiScan.  I would very much like to see what his heart rate does with activity to see if it does trigger some type of arrhythmia.  Cold intolerance TSH and CBC are normal. Defer further workup to his primary physician.    Orders Placed This Encounter  Procedures  . Myocardial Perfusion Imaging    Exercise induced tachycardia- want to see if heart heartrate increase    Standing Status: Future     Number of Occurrences:      Standing Expiration Date: 06/23/2014    Scheduling Instructions:     Treadmill test  only  Order Specific Question:  Where should this test be performed    Answer:  [facility]    Order Specific Question:  Type of stress    Answer:  Treadmill    Order Specific Question:  Patient weight in lbs    Answer:  226  . Cardiac event monitor    palp    Standing Status: Future     Number of Occurrences:      Standing Expiration Date: 06/23/2014    Order Specific Question:  Where should this test be performed    Answer:  [facility] [place]   Meds ordered this encounter  Medications  . metoprolol tartrate (LOPRESSOR) 25 MG tablet    Sig: Take 1 tablet (25 mg total) by mouth 2 (two) times daily.    Dispense:  60 tablet    Refill:  11    Followup: 6-7 weeks  Malcom Selmer W. Herbie Baltimore, M.D., M.S. [facility] [address] [place], [place]  [address]  [phone] Pager # [phone]

## 2013-07-07 NOTE — Assessment & Plan Note (Signed)
TSH and CBC are normal. Defer further workup to his primary physician.

## 2013-07-07 NOTE — Assessment & Plan Note (Signed)
Borderline control. Beta blocker would potentially help bring down into normal range.

## 2013-07-07 NOTE — Assessment & Plan Note (Signed)
He certainly has symptoms that would suggest maybe inappropriate tachycardia. I do not have him wear the monitor in order to get some data. Placed in the monitor on today. We have had him tested CardioNet to insure that the patient is not charged for an eczema since the monitors were nonfunctional.  Am reluctant to treat anything until I see what is I am treating, but because he is so symptomatic I do think that putting her on low-dose beta blocker will be okay. Start with 12.5 twice a day and bring it up over the month to 25 twice a day.

## 2013-07-07 NOTE — Assessment & Plan Note (Addendum)
He cannot be sure for certain that his discomfort he had excessive episodes of months ago is not due to the potential of coronary disease. He does not have many risk factors.  I do a CLO test on treadmill. He's not sure that he will be able to walk very far on treadmill stating something about some arthritic pains are pretty hurt ankle. Just to be sure that we get a accurate test ordered this is a Myoview which was thought of trying a treadmill and if need be switch it to a LexiScan.  I would very much like to see what his heart rate does with activity to see if it does trigger some type of arrhythmia.

## 2013-07-07 NOTE — Assessment & Plan Note (Signed)
The question is is this tachycardia sinus tachycardia versus an arrhythmia.  Plant: Youth worker

## 2013-07-21 ENCOUNTER — Ambulatory Visit (HOSPITAL_COMMUNITY)
Admission: RE | Admit: 2013-07-21 | Discharge: 2013-07-21 | Disposition: A | Discharge status: Home / Self Care | Payer: Medicaid Other | Source: Ambulatory Visit | Attending: Cardiology | Admitting: Cardiology

## 2013-07-21 DIAGNOSIS — R079 Chest pain, unspecified: Secondary | ICD-10-CM | POA: Insufficient documentation

## 2013-07-21 DIAGNOSIS — R000 Tachycardia, unspecified: Secondary | ICD-10-CM

## 2013-07-21 DIAGNOSIS — R002 Palpitations: Secondary | ICD-10-CM

## 2013-07-21 DIAGNOSIS — R0789 Other chest pain: Secondary | ICD-10-CM

## 2013-07-21 HISTORY — PX: OTHER SURGICAL HISTORY: SHX169

## 2013-07-22 ENCOUNTER — Encounter: Payer: Self-pay | Admitting: Cardiology

## 2013-08-05 ENCOUNTER — Ambulatory Visit (INDEPENDENT_AMBULATORY_CARE_PROVIDER_SITE_OTHER): Payer: Medicaid Other | Admitting: Cardiology

## 2013-08-05 ENCOUNTER — Encounter: Payer: Self-pay | Admitting: Cardiology

## 2013-08-05 VITALS — BP 122/62 | HR 88 | Ht 76.5 in | Wt 226.9 lb

## 2013-08-05 DIAGNOSIS — I10 Essential (primary) hypertension: Secondary | ICD-10-CM

## 2013-08-05 DIAGNOSIS — R0609 Other forms of dyspnea: Secondary | ICD-10-CM | POA: Insufficient documentation

## 2013-08-05 DIAGNOSIS — R002 Palpitations: Secondary | ICD-10-CM

## 2013-08-05 DIAGNOSIS — G4700 Insomnia, unspecified: Secondary | ICD-10-CM

## 2013-08-05 NOTE — Patient Instructions (Addendum)
OK -  Plan: Increase Metoprolol to 25 mg 2 x daily Check Echocardiogram Decrease Caffeine intake, & hydrate (drink water) - urine should be almost clear. Take ~25 mg Benadryl every couple of nights to try to get a good night of sleep.    Follow-up in ~2-3 months.  Marykay Lex, MD  Your physician wants you to follow-up in: 3 You will receive a reminder letter in the mail two months in advance. If you don't receive a letter, please call our office to schedule the follow-up appointment.  Your physician has requested that you have an echocardiogram. Echocardiography is a painless test that uses sound waves to create images of your heart. It provides your doctor with information about the size and shape of your heart and how well your heart's chambers and valves are working. This procedure takes approximately one hour. There are no restrictions for this procedure.
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
## 2013-08-07 ENCOUNTER — Encounter: Payer: Self-pay | Admitting: Cardiology

## 2013-08-07 DIAGNOSIS — G4700 Insomnia, unspecified: Secondary | ICD-10-CM | POA: Insufficient documentation

## 2013-08-07 NOTE — Assessment & Plan Note (Signed)
He still this exertion, but did fairly well on his treadmill stress test.  I don't think that this is due to cardiac ischemia, but I do worry that there could be other potential cardiac etiologies of his potentially diastolic dysfunction or valvular abnormalities not heard on exam.  Plan: 2-D echocardiogram

## 2013-08-07 NOTE — Assessment & Plan Note (Signed)
Blood pressure is much better on low-dose beta blocker.  He should be able to tolerate a higher dose.

## 2013-08-07 NOTE — Assessment & Plan Note (Addendum)
Somewhat improved.  Nothing noted on monitor.  This may simply just be responsive sinus tachycardia.  Plan:  Increase Metoprolol to 25 mg 2 x daily  Decrease Caffeine intake, & hydrate (drink water) - urine should be almost clear.  2-D echocardiogram
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
## 2013-08-17 ENCOUNTER — Ambulatory Visit (HOSPITAL_COMMUNITY): Payer: Medicaid Other

## 2013-08-25 ENCOUNTER — Ambulatory Visit (HOSPITAL_COMMUNITY): Payer: Medicaid Other

## 2013-08-27 ENCOUNTER — Ambulatory Visit (HOSPITAL_COMMUNITY)
Admission: RE | Admit: 2013-08-27 | Discharge: 2013-08-27 | Disposition: A | Discharge status: Home / Self Care | Payer: Medicaid Other | Source: Ambulatory Visit | Attending: Cardiovascular Disease | Admitting: Cardiovascular Disease

## 2013-08-27 DIAGNOSIS — I10 Essential (primary) hypertension: Secondary | ICD-10-CM

## 2013-08-27 DIAGNOSIS — R002 Palpitations: Secondary | ICD-10-CM | POA: Insufficient documentation

## 2013-08-27 DIAGNOSIS — R0609 Other forms of dyspnea: Secondary | ICD-10-CM | POA: Insufficient documentation

## 2013-08-27 DIAGNOSIS — R0602 Shortness of breath: Secondary | ICD-10-CM

## 2013-08-27 DIAGNOSIS — R0989 Other specified symptoms and signs involving the circulatory and respiratory systems: Secondary | ICD-10-CM | POA: Insufficient documentation

## 2013-08-27 NOTE — Progress Notes (Signed)
2D Echo Performed 11/06/2012    Mileena Rothenberger, RCS  

## 2013-09-02 ENCOUNTER — Telehealth: Payer: Self-pay | Admitting: *Deleted

## 2013-09-02 NOTE — Telephone Encounter (Signed)
Spoke to a family member-AMY - LEFT MESSAGE FOR PATIENT TO CALL BACK

## 2013-09-02 NOTE — Telephone Encounter (Signed)
Message copied by Tobin Chad on Wed Sep 02, 2013  8:41 AM ------      Message from: Marykay Lex      Created: Fri Aug 28, 2013  6:02 PM       Essentially normal Echo.            Marykay Lex, MD       ------

## 2013-09-03 ENCOUNTER — Encounter: Payer: Self-pay | Admitting: *Deleted

## 2013-09-03 NOTE — Telephone Encounter (Signed)
Called spoke to family member- AMY She states the patient is at work until 7 pm,he does not have a cell phone.  RN informed her that a letter will sent to him.

## 2013-09-08 ENCOUNTER — Other Ambulatory Visit: Payer: Self-pay | Admitting: *Deleted

## 2013-09-08 DIAGNOSIS — R002 Palpitations: Secondary | ICD-10-CM

## 2014-01-11 ENCOUNTER — Other Ambulatory Visit: Payer: Medicaid Other

## 2014-01-11 DIAGNOSIS — E785 Hyperlipidemia, unspecified: Secondary | ICD-10-CM

## 2014-01-11 DIAGNOSIS — Z0000 Encounter for general adult medical examination without abnormal findings: Secondary | ICD-10-CM

## 2014-01-11 DIAGNOSIS — F319 Bipolar disorder, unspecified: Secondary | ICD-10-CM

## 2014-01-11 DIAGNOSIS — I10 Essential (primary) hypertension: Secondary | ICD-10-CM

## 2014-01-11 DIAGNOSIS — Z79899 Other long term (current) drug therapy: Secondary | ICD-10-CM

## 2014-01-11 LAB — CBC WITH DIFFERENTIAL/PLATELET
BASOS ABS: 0.1 10*3/uL (ref 0.0–0.1)
Basophils Relative: 1 % (ref 0–1)
EOS PCT: 4 % (ref 0–5)
Eosinophils Absolute: 0.2 10*3/uL (ref 0.0–0.7)
HEMATOCRIT: 37.1 % — AB (ref 39.0–52.0)
Hemoglobin: 12.6 g/dL — ABNORMAL LOW (ref 13.0–17.0)
LYMPHS ABS: 2.2 10*3/uL (ref 0.7–4.0)
LYMPHS PCT: 37 % (ref 12–46)
MCH: 29.1 pg (ref 26.0–34.0)
MCHC: 34 g/dL (ref 30.0–36.0)
MCV: 85.7 fL (ref 78.0–100.0)
Monocytes Absolute: 0.6 10*3/uL (ref 0.1–1.0)
Monocytes Relative: 10 % (ref 3–12)
NEUTROS ABS: 2.8 10*3/uL (ref 1.7–7.7)
Neutrophils Relative %: 48 % (ref 43–77)
PLATELETS: 195 10*3/uL (ref 150–400)
RBC: 4.33 MIL/uL (ref 4.22–5.81)
RDW: 13.7 % (ref 11.5–15.5)
WBC: 5.9 10*3/uL (ref 4.0–10.5)

## 2014-01-12 LAB — COMPLETE METABOLIC PANEL WITH GFR
ALT: 31 U/L (ref 0–53)
AST: 25 U/L (ref 0–37)
Albumin: 4.7 g/dL (ref 3.5–5.2)
Alkaline Phosphatase: 69 U/L (ref 39–117)
BUN: 11 mg/dL (ref 6–23)
CALCIUM: 9.6 mg/dL (ref 8.4–10.5)
CHLORIDE: 105 meq/L (ref 96–112)
CO2: 25 meq/L (ref 19–32)
Creat: 0.87 mg/dL (ref 0.50–1.35)
GFR, Est African American: >89 mL/min
GLUCOSE: 76 mg/dL (ref 70–99)
Potassium: 3.7 mEq/L (ref 3.5–5.3)
SODIUM: 140 meq/L (ref 135–145)
Total Bilirubin: 0.5 mg/dL (ref 0.2–1.2)
Total Protein: 6.9 g/dL (ref 6.0–8.3)

## 2014-01-12 LAB — LIPID PANEL
CHOLESTEROL: 168 mg/dL (ref 0–200)
HDL: 42 mg/dL (ref >39)
LDL Cholesterol: 112 mg/dL — ABNORMAL HIGH (ref 0–99)
TRIGLYCERIDES: 69 mg/dL (ref <150)
Total CHOL/HDL Ratio: 4 Ratio
VLDL: 14 mg/dL (ref 0–40)

## 2014-01-12 LAB — TSH: TSH: 0.933 u[IU]/mL (ref 0.350–4.500)

## 2014-01-14 ENCOUNTER — Encounter: Payer: Self-pay | Admitting: Family Medicine

## 2014-01-14 ENCOUNTER — Ambulatory Visit (INDEPENDENT_AMBULATORY_CARE_PROVIDER_SITE_OTHER): Payer: Medicaid Other | Admitting: Family Medicine

## 2014-01-14 VITALS — BP 136/72 | HR 100 | Temp 97.0°F | Resp 18 | Ht 76.0 in | Wt 226.0 lb

## 2014-01-14 DIAGNOSIS — Z0000 Encounter for general adult medical examination without abnormal findings: Secondary | ICD-10-CM

## 2014-01-14 DIAGNOSIS — F319 Bipolar disorder, unspecified: Secondary | ICD-10-CM

## 2014-01-14 MED ORDER — ARIPIPRAZOLE 10 MG PO TABS: 10.0000 mg | ORAL_TABLET | Freq: Every day | ORAL | Status: DC

## 2014-01-14 NOTE — Progress Notes (Signed)
Subjective:    Patient ID: Samuel Reynolds, male    DOB: 03-12-1986, 28 y.o.   MRN: [id number]  HPI Patient is a 28 year old white male who has a history of bipolar disorder. He has been off his Abilify since last fall. He is here today with his wife. He is reporting while mood swings, trouble controlling his anger, increased impulsivity, poor decision-making, depression, anger control issues, he has even made suicidal threats to his wife.  He threatened that if she ever left him he would commit suicide. He has extreme jealousy and poor self-confidence. His chest is unfounded the tonsil is red his wife will leave him. Upon further questioning he has had a previous wife and a serious girlfriend who have left him.  He has a poor relationship with his mom. She will not talk to him. Been having off and on again relationship his entire life. He is a difficult time keeping a job. He's had 3 jobs in the last 12 months. He frequently loses his job due to his inability to control his temper and confrontation he has with his bosses.   Past Medical History  Diagnosis Date  . Allergy     seasonal  . Bipolar 1 disorder   . Anemia   . Hypertension   . Seizure disorder 1994, 2001  . Dyslipidemia     Per patient report  . H/O syncope     Since teenager. Also vasovagal with blood draws.   Past Surgical History  Procedure Laterality Date  . Exercise tolerance test  07/21/2013    [facility] protocol: 9:62mn, 10.5 METs; HR from 90 to-171 (87% max predicted); BP 125/84 mmHg --> 159/65 mmHg; no arrhythmia or ectopy, no ischemic ECG changes --NEGATIVE GXT, mildly delayed recovery time  . Cardiac monitor  9/3-10/2 2014    NSR, sinus tachycardia to 104-110 bpm.;  No PAC/PVC or arrhythmias.   No current outpatient prescriptions on file prior to visit.   No current facility-administered medications on file prior to visit.   Allergies  Allergen Reactions  . Benadryl [Diphenhydramine Hcl]   . Hydrocodone Hives and  Nausea And Vomiting   History   Social History  . Marital Status: Married    Spouse Name: N/A    Number of Children: N/A  . Years of Education: N/A   Occupational History  . Not on file.   Social History Main Topics  . Smoking status: Never Smoker   . Smokeless tobacco: Never Used  . Alcohol Use: No  . Drug Use: No  . Sexual Activity: Not on file   Other Topics Concern  . Not on file   Social History Narrative   Married father of one. Lives with his wife and daughter as well as his mother-in-law and sister-in-law and nephew. He drinks occasional alcohol, but never smoked or use of tobacco products. Does not exercise because he "does not feel like he has the time her energy.   Family History  Problem Relation Age of Onset  . Hypertension Mother   . Hyperlipidemia Mother   . Hyperlipidemia Father   . Hypertension Father   . Cancer Mother   . Hyperlipidemia Maternal Grandmother   . Hypertension Maternal Grandmother   . Diabetes Maternal Grandmother   . Hyperlipidemia Maternal Grandfather   . Hypertension Maternal Grandfather   . Hypertension Paternal Grandmother   . Hyperlipidemia Paternal Grandmother   . Stroke Maternal Grandfather   . Cancer Paternal Grandmother   . Sudden death  Paternal Grandfather   . Hypertension Paternal Grandfather   . Hyperlipidemia Paternal Grandfather   . Hyperlipidemia Sister   . Hypertension Sister   . Heart disease      Reportedly his father had PCI at age 43, this is not confirmed on the patient's report      Review of Systems  All other systems reviewed and are negative.       Objective:   Physical Exam  Vitals reviewed. Constitutional: He is oriented to person, place, and time. He appears well-developed and well-nourished. No distress.  HENT:  Head: Normocephalic and atraumatic.  Right Ear: External ear normal.  Left Ear: External ear normal.  Nose: Nose normal.  Mouth/Throat: Oropharynx is clear and moist. No  oropharyngeal exudate.  Eyes: Conjunctivae and EOM are normal. Pupils are equal, round, and reactive to light. Right eye exhibits no discharge. Left eye exhibits no discharge. No scleral icterus.  Neck: Normal range of motion. Neck supple. No JVD present. No tracheal deviation present. No thyromegaly present.  Cardiovascular: Normal rate, regular rhythm, normal heart sounds and intact distal pulses.  Exam reveals no gallop and no friction rub.   No murmur heard. Pulmonary/Chest: Effort normal and breath sounds normal. No stridor. No respiratory distress. He has no wheezes. He has no rales. He exhibits no tenderness.  Abdominal: Soft. Bowel sounds are normal. He exhibits no distension and no mass. There is no tenderness. There is no rebound and no guarding.  Musculoskeletal: Normal range of motion. He exhibits no edema and no tenderness.  Lymphadenopathy:    He has no cervical adenopathy.  Neurological: He is alert and oriented to person, place, and time. He has normal reflexes. He displays normal reflexes. No cranial nerve deficit. He exhibits normal muscle tone. Coordination normal.  Skin: Skin is warm. No rash noted. He is not diaphoretic. No erythema. No pallor.  Psychiatric: He has a normal mood and affect. His behavior is normal. Judgment and thought content normal.          Assessment & Plan:  1. Bipolar disorder, unspecified Resume abilify.  Take 5 mg poqday and increase to 10 mg poqday after 2 weeks.  I also gave the patient contact information psychiatrist instead of scanning. I recommended that he should seek counseling to help control his anger and to deal with his poor self-esteem - ARIPiprazole (ABILIFY) 10 MG tablet; Take 1 tablet (10 mg total) by mouth daily.  Dispense: 30 tablet; Refill: 3  2. Routine general medical examination at a health care facility The remainder of his physical exam is normal. His most recent labwork as listed below: Lab on 01/11/2014  Component Date  Value Ref Range Status  . WBC 01/11/2014 5.9  4.0 - 10.5 K/uL Final  . RBC 01/11/2014 4.33  4.22 - 5.81 MIL/uL Final  . Hemoglobin 01/11/2014 12.6* 13.0 - 17.0 g/dL Final  . HCT 01/11/2014 37.1* 39.0 - 52.0 % Final  . MCV 01/11/2014 85.7  78.0 - 100.0 fL Final  . MCH 01/11/2014 29.1  26.0 - 34.0 pg Final  . MCHC 01/11/2014 34.0  30.0 - 36.0 g/dL Final  . RDW 01/11/2014 13.7  11.5 - 15.5 % Final  . Platelets 01/11/2014 195  150 - 400 K/uL Final  . Neutrophils Relative % 01/11/2014 48  43 - 77 % Final  . Neutro Abs 01/11/2014 2.8  1.7 - 7.7 K/uL Final  . Lymphocytes Relative 01/11/2014 37  12 - 46 % Final  . Lymphs Abs  01/11/2014 2.2  0.7 - 4.0 K/uL Final  . Monocytes Relative 01/11/2014 10  3 - 12 % Final  . Monocytes Absolute 01/11/2014 0.6  0.1 - 1.0 K/uL Final  . Eosinophils Relative 01/11/2014 4  0 - 5 % Final  . Eosinophils Absolute 01/11/2014 0.2  0.0 - 0.7 K/uL Final  . Basophils Relative 01/11/2014 1  0 - 1 % Final  . Basophils Absolute 01/11/2014 0.1  0.0 - 0.1 K/uL Final  . Smear Review 01/11/2014 Criteria for review not met   Final  . Cholesterol 01/11/2014 168  0 - 200 mg/dL Final   Comment: ATP III Classification:                                < 200        mg/dL        Desirable                               200 - 239     mg/dL        Borderline High                               >= 240        mg/dL        High                             . Triglycerides 01/11/2014 69  <150 mg/dL Final  . HDL 01/11/2014 42  >39 mg/dL Final  . Total CHOL/HDL Ratio 01/11/2014 4.0   Final  . VLDL 01/11/2014 14  0 - 40 mg/dL Final  . LDL Cholesterol 01/11/2014 112* 0 - 99 mg/dL Final   Comment:                            Total Cholesterol/HDL Ratio:CHD Risk                                                 Coronary Heart Disease Risk Table                                                                 Men       Women                                   1/2 Average Risk              3.4         3.3                                       Average Risk  5.0        4.4                                    2X Average Risk              9.6        7.1                                    3X Average Risk             23.4       11.0                          Use the calculated Patient Ratio above and the CHD Risk table                           to determine the patient's CHD Risk.                          ATP III Classification (LDL):                                < 100        mg/dL         Optimal                               100 - 129     mg/dL         Near or Above Optimal                               130 - 159     mg/dL         Borderline High                               160 - 189     mg/dL         High                                > 190        mg/dL         Very High                             . TSH 01/11/2014 0.933  0.350 - 4.500 uIU/mL Final  . Sodium 01/11/2014 140  135 - 145 mEq/L Final  . Potassium 01/11/2014 3.7  3.5 - 5.3 mEq/L Final  . Chloride 01/11/2014 105  96 - 112 mEq/L Final  . CO2 01/11/2014 25  19 - 32 mEq/L Final  . Glucose, Bld 01/11/2014 76  70 - 99 mg/dL Final  . BUN 01/11/2014 11  6 - 23 mg/dL Final  . Creat 01/11/2014 0.87  0.50 - 1.35 mg/dL Final  . Total Bilirubin 01/11/2014 0.5  0.2 - 1.2 mg/dL Final  .  Alkaline Phosphatase 01/11/2014 69  39 - 117 U/L Final  . AST 01/11/2014 25  0 - 37 U/L Final  . ALT 01/11/2014 31  0 - 53 U/L Final  . Total Protein 01/11/2014 6.9  6.0 - 8.3 g/dL Final  . Albumin 01/11/2014 4.7  3.5 - 5.2 g/dL Final  . Calcium 01/11/2014 9.6  8.4 - 10.5 mg/dL Final  . GFR, Est African American 01/11/2014 >89   Final  . GFR, Est Non African American 01/11/2014 >89   Final   Comment:                            The estimated GFR is a calculation valid for adults (>=65 years old)                          that uses the CKD-EPI algorithm to adjust for age and sex. It is                            not to be used for children,  pregnant women, hospitalized patients,                             patients on dialysis, or with rapidly changing kidney function.                          According to the NKDEP, eGFR >89 is normal, 60-89 shows mild                          impairment, 30-59 shows moderate impairment, 15-29 shows severe                          impairment and <15 is ESRD.                              Patient's labwork is excellent.  Recheck in 4-6 weeks or sooner if worse on the abilify.

## 2014-04-30 ENCOUNTER — Ambulatory Visit: Payer: Medicaid Other | Admitting: Family Medicine

## 2014-07-18 ENCOUNTER — Emergency Department (HOSPITAL_COMMUNITY): Payer: Medicaid Other

## 2014-07-18 ENCOUNTER — Emergency Department (HOSPITAL_COMMUNITY)
Admission: EM | Admit: 2014-07-18 | Discharge: 2014-07-19 | Disposition: A | Discharge status: Home / Self Care | Payer: Medicaid Other | Attending: Emergency Medicine | Admitting: Emergency Medicine

## 2014-07-18 ENCOUNTER — Encounter (HOSPITAL_COMMUNITY): Payer: Self-pay | Admitting: Emergency Medicine

## 2014-07-18 DIAGNOSIS — S0993XA Unspecified injury of face, initial encounter: Secondary | ICD-10-CM | POA: Insufficient documentation

## 2014-07-18 DIAGNOSIS — Y9289 Other specified places as the place of occurrence of the external cause: Secondary | ICD-10-CM | POA: Diagnosis not present

## 2014-07-18 DIAGNOSIS — M545 Low back pain, unspecified: Secondary | ICD-10-CM

## 2014-07-18 DIAGNOSIS — Z8659 Personal history of other mental and behavioral disorders: Secondary | ICD-10-CM | POA: Diagnosis not present

## 2014-07-18 DIAGNOSIS — W010XXA Fall on same level from slipping, tripping and stumbling without subsequent striking against object, initial encounter: Secondary | ICD-10-CM | POA: Insufficient documentation

## 2014-07-18 DIAGNOSIS — Y9389 Activity, other specified: Secondary | ICD-10-CM | POA: Diagnosis not present

## 2014-07-18 DIAGNOSIS — W1789XA Other fall from one level to another, initial encounter: Secondary | ICD-10-CM | POA: Insufficient documentation

## 2014-07-18 DIAGNOSIS — W19XXXA Unspecified fall, initial encounter: Secondary | ICD-10-CM

## 2014-07-18 DIAGNOSIS — Z8669 Personal history of other diseases of the nervous system and sense organs: Secondary | ICD-10-CM | POA: Insufficient documentation

## 2014-07-18 DIAGNOSIS — I10 Essential (primary) hypertension: Secondary | ICD-10-CM | POA: Diagnosis not present

## 2014-07-18 DIAGNOSIS — S199XXA Unspecified injury of neck, initial encounter: Secondary | ICD-10-CM

## 2014-07-18 DIAGNOSIS — Z862 Personal history of diseases of the blood and blood-forming organs and certain disorders involving the immune mechanism: Secondary | ICD-10-CM | POA: Insufficient documentation

## 2014-07-18 DIAGNOSIS — R209 Unspecified disturbances of skin sensation: Secondary | ICD-10-CM | POA: Insufficient documentation

## 2014-07-18 DIAGNOSIS — IMO0002 Reserved for concepts with insufficient information to code with codable children: Secondary | ICD-10-CM | POA: Diagnosis present

## 2014-07-18 MED ORDER — FENTANYL CITRATE 0.05 MG/ML IJ SOLN
100.0000 ug | Freq: Once | INTRAMUSCULAR | Status: AC
  Administered 2014-07-18: 100 ug via INTRAVENOUS
  Filled 2014-07-18: qty 2

## 2014-07-18 MED ORDER — CYCLOBENZAPRINE HCL 10 MG PO TABS: 10.0000 mg | ORAL_TABLET | Freq: Two times a day (BID) | ORAL | Status: DC | PRN

## 2014-07-18 MED ORDER — NAPROXEN 500 MG PO TABS: 500.0000 mg | ORAL_TABLET | Freq: Two times a day (BID) | ORAL | Status: DC

## 2014-07-18 MED ORDER — OXYCODONE-ACETAMINOPHEN 5-325 MG PO TABS: 1.0000 | ORAL_TABLET | ORAL | Status: DC | PRN

## 2014-07-18 NOTE — ED Notes (Signed)
Patient states that he has numbness in the right arm. Patient is able to move all extremities.

## 2014-07-18 NOTE — ED Provider Notes (Signed)
CSN: [id number]     Arrival date & time 07/18/14  2117 History   First MD Initiated Contact with Patient 07/18/14 2130     Chief Complaint  Patient presents with  . Fall     (Consider location/radiation/quality/duration/timing/severity/associated sxs/prior Treatment) HPI Comments: Patient fell backward off the top of a trailer today directly onto his back He c/o pain mostly in the thoracic and lumbar spine. He feels new paresthesia in the L hand. He admits to amnestic period between fall and when he was being helped up by his friend. He was able to ambulate but co severe back pain.  Denies fevers, chills, myalgias, arthralgias. Denies DOE, SOB, chest tightness or pressure, r jaw or back, or diaphoresis. Denies dysuria, flank pain, suprapubic pain, frequency, urgency, or hematuria. Denies headaches, light headedness, weakness, visual disturbances. Denies abdominal pain, nausea, vomiting, diarrhea or constipation.    Patient is a 28 y.o. male presenting with fall. The history is provided by the patient. No language interpreter was used.  Fall This is a new problem. The current episode started today. The problem has been unchanged. Associated symptoms include neck pain and numbness (L arm). Pertinent negatives include no abdominal pain, anorexia, arthralgias, change in bowel habit, chest pain, chills, congestion, coughing, diaphoresis, fatigue, fever, headaches, joint swelling, myalgias, nausea, rash, sore throat, swollen glands, urinary symptoms, vertigo, visual change, vomiting or weakness.    . Past Medical History  Diagnosis Date  . Allergy     seasonal  . Bipolar 1 disorder   . Anemia   . Hypertension   . Seizure disorder 1994, 2001  . Dyslipidemia     Per patient report  . H/O syncope     Since teenager. Also vasovagal with blood draws.   Past Surgical History  Procedure Laterality Date  . Exercise tolerance test  07/21/2013    [facility] protocol: 9:17min, 10.5 METs; HR from 90  to-171 (87% max predicted); BP 125/84 mmHg --> 159/65 mmHg; no arrhythmia or ectopy, no ischemic ECG changes --NEGATIVE GXT, mildly delayed recovery time  . Cardiac monitor  9/3-10/2 2014    NSR, sinus tachycardia to 104-110 bpm.;  No PAC/PVC or arrhythmias.   Family History  Problem Relation Age of Onset  . Hypertension Mother   . Hyperlipidemia Mother   . Hyperlipidemia Father   . Hypertension Father   . Cancer Mother   . Hyperlipidemia Maternal Grandmother   . Hypertension Maternal Grandmother   . Diabetes Maternal Grandmother   . Hyperlipidemia Maternal Grandfather   . Hypertension Maternal Grandfather   . Hypertension Paternal Grandmother   . Hyperlipidemia Paternal Grandmother   . Stroke Maternal Grandfather   . Cancer Paternal Grandmother   . Sudden death Paternal Grandfather   . Hypertension Paternal Grandfather   . Hyperlipidemia Paternal Grandfather   . Hyperlipidemia Sister   . Hypertension Sister   . Heart disease      Reportedly his father had PCI at age 50, this is not confirmed on the patient's report   History  Substance Use Topics  . Smoking status: Never Smoker   . Smokeless tobacco: Never Used  . Alcohol Use: No    Review of Systems  Constitutional: Negative for fever, chills, diaphoresis and fatigue.  HENT: Negative for congestion and sore throat.   Respiratory: Negative for cough.   Cardiovascular: Negative for chest pain.  Gastrointestinal: Negative for nausea, vomiting, abdominal pain, anorexia and change in bowel habit.  Musculoskeletal: Positive for neck pain. Negative for  arthralgias, joint swelling and myalgias.  Skin: Negative for rash.  Neurological: Positive for numbness (L arm). Negative for vertigo, weakness and headaches.      Allergies  Hydrocodone and Benadryl  Home Medications   Prior to Admission medications   Not on File   BP 160/88  Pulse 103  Temp(Src) 98.4 F (36.9 C) (Oral)  Resp 16  Ht  (1.93 m)  Wt 226 lb  (102.513 kg)  BMI 27.52 kg/m2  SpO2 100% Physical Exam  Nursing note and vitals reviewed. Constitutional: He is oriented to person, place, and time. He appears well-developed and well-nourished. No distress.  HENT:  Head: Normocephalic and atraumatic.  Nose: Nose normal. No nasal septal hematoma.  Mouth/Throat: Uvula is midline, oropharynx is clear and moist and mucous membranes are normal.  Eyes: Conjunctivae and EOM are normal. Pupils are equal, round, and reactive to light.  Neck: Normal range of motion. No spinous process tenderness and no muscular tenderness present. No rigidity. Normal range of motion present.  Cardiovascular: Normal rate, regular rhythm and intact distal pulses.   Pulses:      Radial pulses are 2+ on the right side, and 2+ on the left side.       Dorsalis pedis pulses are 2+ on the right side, and 2+ on the left side.       Posterior tibial pulses are 2+ on the right side, and 2+ on the left side.  Pulmonary/Chest: Effort normal and breath sounds normal. No accessory muscle usage. No respiratory distress. He has no decreased breath sounds. He has no wheezes. He has no rhonchi. He has no rales. He exhibits no tenderness and no bony tenderness.   No flail segment, crepitus or deformity Equal chest expansion  Abdominal: Soft. Normal appearance and bowel sounds are normal. There is no tenderness. There is no rigidity, no guarding and no CVA tenderness.   Abd soft and nontender  Musculoskeletal: Normal range of motion.       Thoracic back: He exhibits normal range of motion.       Lumbar back: He exhibits normal range of motion.  Midline tenderness throacolumbar region. Pain with lifting his legs.  Lymphadenopathy:    He has no cervical adenopathy.  Neurological: He is alert and oriented to person, place, and time. No cranial nerve deficit. GCS eye subscore is 4. GCS verbal subscore is 5. GCS motor subscore is 6.  Reflex Scores:      Tricep reflexes are 2+ on the  right side and 2+ on the left side.      Bicep reflexes are 2+ on the right side and 2+ on the left side.      Brachioradialis reflexes are 2+ on the right side and 2+ on the left side.      Patellar reflexes are 2+ on the right side and 2+ on the left side.      Achilles reflexes are 2+ on the right side and 2+ on the left side. Speech is clear and goal oriented, follows commands Normal 5/5 strength in upper and lower extremities bilaterally including dorsiflexion and plantar flexion, strong and equal grip strength Sensation normal to light and sharp touch Moves extremities without ataxia, coordination intact   Skin: Skin is warm and dry. No rash noted. He is not diaphoretic. No erythema.  Psychiatric: He has a normal mood and affect.    ED Course  Procedures (including critical care time) Labs Review Labs Reviewed - No data to  display  Imaging Review No results found.   EKG Interpretation None      MDM   Final diagnoses:  Fall, initial encounter  Midline low back pain without sciatica    Patient without signs of serious head, neck, or back injury. Normal neurological exam. No concern for closed head injury, lung injury, or intraabdominal injury. D/t pts normal radiology & ability to ambulate in ED pt will be dc home with symptomatic therapy. Pt has been instructed to follow up with their doctor if symptoms persist. Home conservative therapies for pain including ice and heat tx have been discussed. Pt is hemodynamically stable, in NAD, & able to ambulate in the ED. Pain has been managed & has no complaints prior to dc.     Arthor Captain, PA-C 07/23/14 2050

## 2014-07-18 NOTE — ED Notes (Signed)
Patient is alert and oriented x3.  He is complaining of a fall from a construction trailer.   Patient states that he slipped off the wet surface and fell 9 feet then landed on his back. Patient states that he is having pain 9 of 10 in his middle and lower back with numbness  In the right arm.

## 2014-07-18 NOTE — Discharge Instructions (Signed)
Your imaging was negative. You can expect to have increasing pain over the next couple of days, however your pain should improve after that.   SEEK IMMEDIATE MEDICAL ATTENTION IF: New numbness, tingling, weakness, or problem with the use of your arms or legs.  Severe back pain not relieved with medications.  Change in bowel or bladder control.  Increasing pain in any areas of the body (such as chest or abdominal pain).  Shortness of breath, dizziness or fainting.  Nausea (feeling sick to your stomach), vomiting, fever, or sweats.   Back Injury Prevention Back injuries can be extremely painful and difficult to heal. After having one back injury, you are much more likely to experience another later on. It is important to learn how to avoid injuring or re-injuring your back. The following tips can help you to prevent a back injury. PHYSICAL FITNESS  Exercise regularly and try to develop good tone in your abdominal muscles. Your abdominal muscles provide a lot of the support needed by your back.  Do aerobic exercises (walking, jogging, biking, swimming) regularly.  Do exercises that increase balance and strength (tai chi, yoga) regularly. This can decrease your risk of falling and injuring your back.  Stretch before and after exercising.  Maintain a healthy weight. The more you weigh, the more stress is placed on your back. For every pound of weight, 10 times that amount of pressure is placed on the back. DIET  Talk to your caregiver about how much calcium and vitamin D you need per day. These nutrients help to prevent weakening of the bones (osteoporosis). Osteoporosis can cause broken (fractured) bones that lead to back pain.  Include good sources of calcium in your diet, such as dairy products, green, leafy vegetables, and products with calcium added (fortified).  Include good sources of vitamin D in your diet, such as milk and foods that are fortified with vitamin D.  Consider taking  a nutritional supplement or a multivitamin if needed.  Stop smoking if you smoke. POSTURE  Sit and stand up straight. Avoid leaning forward when you sit or hunching over when you stand.  Choose chairs with good low back (lumbar) support.  If you work at a desk, sit close to your work so you do not need to lean over. Keep your chin tucked in. Keep your neck drawn back and elbows bent at a right angle. Your arms should look like the letter "L."  Sit high and close to the steering wheel when you drive. Add a lumbar support to your car seat if needed.  Avoid sitting or standing in one position for too long. Take breaks to get up, stretch, and walk around at least once every hour. Take breaks if you are driving for long periods of time.  Sleep on your side with your knees slightly bent, or sleep on your back with a pillow under your knees. Do not sleep on your stomach. LIFTING, TWISTING, AND REACHING  Avoid heavy lifting, especially repetitive lifting. If you must do heavy lifting:  Stretch before lifting.  Work slowly.  Rest between lifts.  Use carts and dollies to move objects when possible.  Make several small trips instead of carrying 1 heavy load.  Ask for help when you need it.  Ask for help when moving big, awkward objects.  Follow these steps when lifting:  Stand with your feet shoulder-width apart.  Get as close to the object as you can. Do not try to pick up heavy objects  that are far from your body.  Use handles or lifting straps if they are available.  Bend at your knees. Squat down, but keep your heels off the floor.  Keep your shoulders pulled back, your chin tucked in, and your back straight.  Lift the object slowly, tightening the muscles in your legs, abdomen, and buttocks. Keep the object as close to the center of your body as possible.  When you put a load down, use these same guidelines in reverse.  Do not:  Lift the object above your waist.  Twist  at the waist while lifting or carrying a load. Move your feet if you need to turn, not your waist.  Bend over without bending at your knees.  Avoid reaching over your head, across a table, or for an object on a high surface. OTHER TIPS  Avoid wet floors and keep sidewalks clear of ice to prevent falls.  Do not sleep on a mattress that is too soft or too hard.  Keep items that are used frequently within easy reach.  Put heavier objects on shelves at waist level and lighter objects on lower or higher shelves.  Find ways to decrease your stress, such as exercise, massage, or relaxation techniques. Stress can build up in your muscles. Tense muscles are more vulnerable to injury.  Seek treatment for depression or anxiety if needed. These conditions can increase your risk of developing back pain. SEEK MEDICAL CARE IF:  You injure your back.  You have questions about diet, exercise, or other ways to prevent back injuries. MAKE SURE YOU:  Understand these instructions.  Will watch your condition.  Will get help right away if you are not doing well or get worse. Document Released: 11/15/2004 Document Revised: 12/31/2011 Document Reviewed: 11/19/2011 [facility] Patient Information 2015 [place], [place]. This information is not intended to replace advice given to you by your health care provider. Make sure you discuss any questions you have with your health care provider.  Back Pain, Adult Low back pain is very common. About 1 in 5 people have back pain.The cause of low back pain is rarely dangerous. The pain often gets better over time.About half of people with a sudden onset of back pain feel better in just 2 weeks. About 8 in 10 people feel better by 6 weeks.  CAUSES Some common causes of back pain include:  Strain of the muscles or ligaments supporting the spine.  Wear and tear (degeneration) of the spinal discs.  Arthritis.  Direct injury to the back. DIAGNOSIS Most of the  time, the direct cause of low back pain is not known.However, back pain can be treated effectively even when the exact cause of the pain is unknown.Answering your caregiver's questions about your overall health and symptoms is one of the most accurate ways to make sure the cause of your pain is not dangerous. If your caregiver needs more information, he or she may order lab work or imaging tests (X-rays or MRIs).However, even if imaging tests show changes in your back, this usually does not require surgery. HOME CARE INSTRUCTIONS For many people, back pain returns.Since low back pain is rarely dangerous, it is often a condition that people can learn to [facility] their own.   Remain active. It is stressful on the back to sit or stand in one place. Do not sit, drive, or stand in one place for more than 30 minutes at a time. Take short walks on level surfaces as soon as pain allows.Try  to increase the length of time you walk each day.  Do not stay in bed.Resting more than 1 or 2 days can delay your recovery.  Do not avoid exercise or work.Your body is made to move.It is not dangerous to be active, even though your back may hurt.Your back will likely heal faster if you return to being active before your pain is gone.  Pay attention to your body when you bend and lift. Many people have less discomfortwhen lifting if they bend their knees, keep the load close to their bodies,and avoid twisting. Often, the most comfortable positions are those that put less stress on your recovering back.  Find a comfortable position to sleep. Use a firm mattress and lie on your side with your knees slightly bent. If you lie on your back, put a pillow under your knees.  Only take over-the-counter or prescription medicines as directed by your caregiver. Over-the-counter medicines to reduce pain and inflammation are often the most helpful.Your caregiver may prescribe muscle relaxant drugs.These medicines help dull  your pain so you can more quickly return to your normal activities and healthy exercise.  Put ice on the injured area.  Put ice in a plastic bag.  Place a towel between your skin and the bag.  Leave the ice on for 15-20 minutes, 03-04 times a day for the first 2 to 3 days. After that, ice and heat may be alternated to reduce pain and spasms.  Ask your caregiver about trying back exercises and gentle massage. This may be of some benefit.  Avoid feeling anxious or stressed.Stress increases muscle tension and can worsen back pain.It is important to recognize when you are anxious or stressed and learn ways to manage it.Exercise is a great option. SEEK MEDICAL CARE IF:  You have pain that is not relieved with rest or medicine.  You have pain that does not improve in 1 week.  You have new symptoms.  You are generally not feeling well. SEEK IMMEDIATE MEDICAL CARE IF:   You have pain that radiates from your back into your legs.  You develop new bowel or bladder control problems.  You have unusual weakness or numbness in your arms or legs.  You develop nausea or vomiting.  You develop abdominal pain.  You feel faint. Document Released: 10/08/2005 Document Revised: 04/08/2012 Document Reviewed: 02/09/2014 [facility] Patient Information 2015 [place], [place]. This information is not intended to replace advice given to you by your health care provider. Make sure you discuss any questions you have with your health care provider. RICE: Routine Care for Injuries The routine care of many injuries includes Rest, Ice, Compression, and Elevation (RICE). HOME CARE INSTRUCTIONS  Rest is needed to allow your body to heal. Routine activities can usually be resumed when comfortable. Injured tendons and bones can take up to 6 weeks to heal. Tendons are the cord-like structures that attach muscle to bone.  Ice following an injury helps keep the swelling down and reduces pain.  Put ice in a plastic  bag.  Place a towel between your skin and the bag.  Leave the ice on for 15-20 minutes, 3-4 times a day, or as directed by your health care provider. Do this while awake, for the first 24 to 48 hours. After that, continue as directed by your caregiver.  Compression helps keep swelling down. It also gives support and helps with discomfort. If an elastic bandage has been applied, it should be removed and reapplied every 3 to 4  hours. It should not be applied tightly, but firmly enough to keep swelling down. Watch fingers or toes for swelling, bluish discoloration, coldness, numbness, or excessive pain. If any of these problems occur, remove the bandage and reapply loosely. Contact your caregiver if these problems continue.  Elevation helps reduce swelling and decreases pain. With extremities, such as the arms, hands, legs, and feet, the injured area should be placed near or above the level of the heart, if possible. SEEK IMMEDIATE MEDICAL CARE IF:  You have persistent pain and swelling.  You develop redness, numbness, or unexpected weakness.  Your symptoms are getting worse rather than improving after several days. These symptoms may indicate that further evaluation or further X-rays are needed. Sometimes, X-rays may not show a small broken bone (fracture) until 1 week or 10 days later. Make a follow-up appointment with your caregiver. Ask when your X-ray results will be ready. Make sure you get your X-ray results. Document Released: 01/20/2001 Document Revised: 10/13/2013 Document Reviewed: 03/09/2011 [facility] Patient Information 2015 [place], [place]. This information is not intended to replace advice given to you by your health care provider. Make sure you discuss any questions you have with your health care provider.

## 2014-07-19 NOTE — ED Notes (Signed)
Patient is alert and oriented x3.  He was given DC instructions and follow up visit instructions.  Patient gave verbal understanding.  He was DC ambulatory under his own power to home.  V/S stable.  He was not showing any signs of distress on DC 

## 2014-07-30 NOTE — ED Provider Notes (Signed)
Medical screening examination/treatment/procedure(s) were conducted as a shared visit with non-physician practitioner(s) and myself.  I personally evaluated the patient during the encounter.  Fall from top of truck onto low back.  Did not hit head or LOC.  Some tingling in L hand.  No weakness. No incontinence.  TTP thoracic and lumbar spine, no stepoffs. Equal grip strengths, 5/5 throughout.  No appreciable sensory deficits.   EKG Interpretation None       Glynn OctaveStephen Ralonda Tartt, MD 07/30/14 [phone]

## 2014-08-23 ENCOUNTER — Ambulatory Visit (INDEPENDENT_AMBULATORY_CARE_PROVIDER_SITE_OTHER): Payer: Medicaid Other | Admitting: Family Medicine

## 2014-08-23 ENCOUNTER — Encounter: Payer: Self-pay | Admitting: Family Medicine

## 2014-08-23 VITALS — BP 144/84 | HR 96 | Temp 98.0°F | Resp 18 | Wt 222.0 lb

## 2014-08-23 DIAGNOSIS — L732 Hidradenitis suppurativa: Secondary | ICD-10-CM

## 2014-08-23 NOTE — Progress Notes (Signed)
Subjective:    Patient ID: Samuel ReiningRandy N Reynolds, male    DOB: Dec 02, 1985, 28 y.o.   MRN: [id number]  HPI Patient presents with recurrent boils in his suprapubic region.  There are numerous erythematous areas 1-2 cm in size which are firm but have spontaneously drained.  There are no fluctuant areas.  There are numerous areas of scar tissue.  He denies any previous boils in the axillary region  This has been a recurrent chronic problem. Past Medical History  Diagnosis Date  . Allergy     seasonal  . Bipolar 1 disorder   . Anemia   . Hypertension   . Seizure disorder 1994, 2001  . Dyslipidemia     Per patient report  . H/O syncope     Since teenager. Also vasovagal with blood draws.   Past Surgical History  Procedure Laterality Date  . Exercise tolerance test  07/21/2013    [facility] protocol: 9:3619min, 10.5 METs; HR from 90 to-171 (87% max predicted); BP 125/84 mmHg --> 159/65 mmHg; no arrhythmia or ectopy, no ischemic ECG changes --NEGATIVE GXT, mildly delayed recovery time  . Cardiac monitor  9/3-10/2 2014    NSR, sinus tachycardia to 104-110 bpm.;  No PAC/PVC or arrhythmias.   Current Outpatient Prescriptions on File Prior to Visit  Medication Sig Dispense Refill  . cyclobenzaprine (FLEXERIL) 10 MG tablet Take 1 tablet (10 mg total) by mouth 2 (two) times daily as needed for muscle spasms. 20 tablet 0  . naproxen (NAPROSYN) 500 MG tablet Take 1 tablet (500 mg total) by mouth 2 (two) times daily with a meal. 30 tablet 0  . oxyCODONE-acetaminophen (PERCOCET) 5-325 MG per tablet Take 1-2 tablets by mouth every 4 (four) hours as needed. 20 tablet 0   No current facility-administered medications on file prior to visit.   Allergies  Allergen Reactions  . Hydrocodone Hives and Nausea And Vomiting  . Benadryl [Diphenhydramine Hcl] Rash   History   Social History  . Marital Status: Legally Separated    Spouse Name: N/A    Number of Children: N/A  . Years of Education: N/A    Occupational History  . Not on file.   Social History Main Topics  . Smoking status: Never Smoker   . Smokeless tobacco: Never Used  . Alcohol Use: No  . Drug Use: No  . Sexual Activity: Not on file   Other Topics Concern  . Not on file   Social History Narrative   Married father of one. Lives with his wife and daughter as well as his mother-in-law and sister-in-law and nephew. He drinks occasional alcohol, but never smoked or use of tobacco products. Does not exercise because he "does not feel like he has the time her energy.      Review of Systems  All other systems reviewed and are negative.      Objective:   Physical Exam  Constitutional: He appears well-developed and well-nourished.  Cardiovascular: Normal rate, regular rhythm and normal heart sounds.   Pulmonary/Chest: Effort normal and breath sounds normal.  Abdominal: Soft. Bowel sounds are normal.  Skin: Rash noted. There is erythema.  Vitals reviewed.         Assessment & Plan:  Hidradenitis suppurativa  Begin doxycycline 100 mg by mouth twice a day for 14 days. I recommended avoidance of shaving in that area. Return for incision and drainage of any fluctuant areas if they develop in the future. Consider treatment with low-dose doxycycline on a  daily basis in the future for prevention

## 2014-09-20 ENCOUNTER — Telehealth: Payer: Self-pay | Admitting: Family Medicine

## 2014-09-20 NOTE — Telephone Encounter (Signed)
Patient calling to say that the antibiotic that dr pickard called in for him was working and the bumps were going away, but now they are coming back now that the med is gone  [phone]

## 2014-09-21 NOTE — Telephone Encounter (Signed)
Lets try doxycycline 50 mg poqday for 1 month to see if areas will resolve.

## 2014-09-22 MED ORDER — DOXYCYCLINE HYCLATE 50 MG PO CAPS: 50.0000 mg | ORAL_CAPSULE | Freq: Every day | ORAL | Status: DC

## 2014-09-22 NOTE — Telephone Encounter (Signed)
Pt aware and med sent to pharm 

## 2015-01-03 ENCOUNTER — Ambulatory Visit (INDEPENDENT_AMBULATORY_CARE_PROVIDER_SITE_OTHER): Payer: Medicaid Other | Admitting: Family Medicine

## 2015-01-03 ENCOUNTER — Encounter: Payer: Self-pay | Admitting: Family Medicine

## 2015-01-03 VITALS — BP 140/82 | HR 88 | Temp 98.2°F | Resp 20 | Ht 76.0 in | Wt 235.0 lb

## 2015-01-03 DIAGNOSIS — J069 Acute upper respiratory infection, unspecified: Secondary | ICD-10-CM

## 2015-01-03 DIAGNOSIS — L732 Hidradenitis suppurativa: Secondary | ICD-10-CM | POA: Diagnosis not present

## 2015-01-03 MED ORDER — ACETAMINOPHEN-CODEINE #3 300-30 MG PO TABS: 1.0000 | ORAL_TABLET | ORAL | Status: DC | PRN

## 2015-01-03 MED ORDER — SULFAMETHOXAZOLE-TRIMETHOPRIM 800-160 MG PO TABS: 1.0000 | ORAL_TABLET | Freq: Two times a day (BID) | ORAL | Status: DC

## 2015-01-03 NOTE — Assessment & Plan Note (Signed)
Concern for worsening hidradenitis with a lot of scarring in the mons pubis area the lesion today seems to been inflamed for quite some time I only was able to express a small amount of pus. I will put him on Bactrim double strength 1 tablet twice a day for 10 days and think he warrants a referral to general surgery for consultation as this is his worst area and he always has active lesions here.

## 2015-01-03 NOTE — Patient Instructions (Addendum)
Take antibiotics as prescribed We will call with culture results Referral to surgeon Take mucinex DM or robitussin DM for the cough F/U as neededHidradenitis Suppurativa, Sweat Gland Abscess Hidradenitis suppurativa is a long lasting (chronic), uncommon disease of the sweat glands. With this, boil-like lumps and scarring develop in the groin, some times under the arms (axillae), and under the breasts. It may also uncommonly occur behind the ears, in the crease of the buttocks, and around the genitals.  CAUSES  The cause is from a blocking of the sweat glands. They then become infected. It may cause drainage and odor. It is not contagious. So it cannot be given to someone else. It most often shows up in puberty (about 1110 to 27 years of age). But it may happen much later. It is similar to acne which is a disease of the sweat glands. This condition is slightly more common in African-Americans and women. SYMPTOMS   Hidradenitis usually starts as one or more red, tender, swellings in the groin or under the arms (axilla).  Over a period of hours to days the lesions get larger. They often open to the skin surface, draining clear to yellow-colored fluid.  The infected area heals with scarring. DIAGNOSIS  Your caregiver makes this diagnosis by looking at you. Sometimes cultures (growing germs on plates in the lab) may be taken. This is to see what germ (bacterium) is causing the infection.  TREATMENT   Topical germ killing medicine applied to the skin (antibiotics) are the treatment of choice. Antibiotics taken by mouth (systemic) are sometimes needed when the condition is getting worse or is severe.  Avoid tight-fitting clothing which traps moisture in.  Dirt does not cause hidradenitis and it is not caused by poor hygiene.  Involved areas should be cleaned daily using an antibacterial soap. Some patients find that the liquid form of Lever 2000, applied to the involved areas as a lotion after  bathing, can help reduce the odor related to this condition.  Sometimes surgery is needed to drain infected areas or remove scarred tissue. Removal of large amounts of tissue is used only in severe cases.  Birth control pills may be helpful.  Oral retinoids (vitamin A derivatives) for 6 to 12 months which are effective for acne may also help this condition.  Weight loss will improve but not cure hidradenitis. It is made worse by being overweight. But the condition is not caused by being overweight.  This condition is more common in people who have had acne.  It may become worse under stress. There is no medical cure for hidradenitis. It can be controlled, but not cured. The condition usually continues for years with periods of getting worse and getting better (remission). Document Released: 05/22/2004 Document Revised: 12/31/2011 Document Reviewed: 01/08/2014 [facility] Patient Information 2015 [employer]. This information is not intended to replace advice given to you by your health care provider. Make sure you discuss any questions you have with your health care provider.

## 2015-01-03 NOTE — Progress Notes (Signed)
Patient ID: Samuel Reynolds, male   DOB: 05-21-1986, 29 y.o.   MRN: [id number]   Subjective:    Patient ID: Samuel Reiningandy N Reynolds, male    DOB: 05-21-1986, 29 y.o.   MRN: [id number]  Patient presents for Cough and bumps  patient here with cough with mild production for the past 3-4 days no fever no shortness of breath. He has not tried any over-the-counter medications. He also has history of recurrent hidradenitis superlative mostly across his mons pubis but he also has regions on the neck and his hairline and has had boils pop up in his chest area as well. He has never seen general surgery regarding this he has had a couple lesions opened up. He was on doxycycline daily but he saw no improvement with this antibiotic. He has a very tender swollen area on the right side of his mons pubis which has been irritated and inflamed for the past couple weeks    Review Of Systems:  GEN- denies fatigue, fever, weight loss,weakness, recent illness HEENT- denies eye drainage, change in vision, nasal discharge, denies sore throat CVS- denies chest pain, palpitations RESP- denies SOB,+ cough, wheeze ABD- denies N/V, change in stools, abd pain GU- denies dysuria, hematuria, dribbling, incontinence MSK- denies joint pain, muscle aches, injury Neuro- denies headache, dizziness, syncope, seizure activity       Objective:    BP 140/82 mmHg  Pulse 88  Temp(Src) 98.2 F (36.8 C) (Oral)  Resp 20  Ht 6\' 4"  (1.93 m)  Wt 235 lb (106.595 kg)  BMI 28.62 kg/m2 GEN- NAD, alert and oriented x3 HEENT- PERRL, EOMI, non injected sclera, pink conjunctiva, MMM, oropharynx clear, nares clear rhinorrhea, TM clear bilat Neck- Supple, no LAD CVS- RRR, no murmur RESP-occasional wheeze, otherwise clear Skin- occipital region, erythematous maculopapular lesions, pustular acne on back, center of chest- small crusted erythematous boil non fluctuant, mons pubis scarring with multiple pits, no active drainage, erythema across, quarter  size lesion  Right side with mild fluctuance and pus, TTP   Procedure- Incision and Drainage Procedure explained to patient questions answered benefits and risks discussed written consent obtained. Antiseptic-Betadine Anesthesia-lidocaine 1% with epi Incision performed over red mons pubis lesion small amount of pus expressed Culture taken Minimal blood loss Patient tolerated procedure well Bandage applied        Assessment & Plan:      Problem List Items Addressed This Visit      Unprioritized   Hidradenitis suppurativa   Relevant Medications   sulfamethoxazole-trimethoprim (BACTRIM DS) tablet 800-160 mg   Other Relevant Orders   Wound culture    Other Visit Diagnoses    Acute URI    -  Primary    supportive care, viral illness    Relevant Medications    sulfamethoxazole-trimethoprim (BACTRIM DS) tablet 800-160 mg       Note: This dictation was prepared with Dragon dictation along with smaller phrase technology. Any transcriptional errors that result from this process are unintentional.

## 2015-01-06 LAB — WOUND CULTURE
Gram Stain: NONE SEEN
Gram Stain: NONE SEEN

## 2015-01-19 ENCOUNTER — Other Ambulatory Visit: Payer: Self-pay | Admitting: Family Medicine

## 2015-01-19 DIAGNOSIS — F319 Bipolar disorder, unspecified: Secondary | ICD-10-CM

## 2015-01-19 DIAGNOSIS — Z79899 Other long term (current) drug therapy: Secondary | ICD-10-CM

## 2015-01-19 DIAGNOSIS — Z0000 Encounter for general adult medical examination without abnormal findings: Secondary | ICD-10-CM

## 2015-01-19 DIAGNOSIS — I10 Essential (primary) hypertension: Secondary | ICD-10-CM

## 2015-01-20 ENCOUNTER — Other Ambulatory Visit: Payer: Medicaid Other

## 2015-01-20 DIAGNOSIS — I10 Essential (primary) hypertension: Secondary | ICD-10-CM

## 2015-01-20 DIAGNOSIS — F319 Bipolar disorder, unspecified: Secondary | ICD-10-CM

## 2015-01-20 DIAGNOSIS — Z79899 Other long term (current) drug therapy: Secondary | ICD-10-CM

## 2015-01-20 DIAGNOSIS — Z0000 Encounter for general adult medical examination without abnormal findings: Secondary | ICD-10-CM

## 2015-01-21 LAB — COMPLETE METABOLIC PANEL WITH GFR
ALBUMIN: 4.4 g/dL (ref 3.5–5.2)
ALK PHOS: 74 U/L (ref 39–117)
ALT: 39 U/L (ref 0–53)
AST: 22 U/L (ref 0–37)
BUN: 12 mg/dL (ref 6–23)
CO2: 24 mEq/L (ref 19–32)
Calcium: 9.3 mg/dL (ref 8.4–10.5)
Chloride: 103 mEq/L (ref 96–112)
Creat: 0.94 mg/dL (ref 0.50–1.35)
GFR, Est African American: >89 mL/min
GFR, Est Non African American: >89 mL/min
GLUCOSE: 91 mg/dL (ref 70–99)
POTASSIUM: 4.3 meq/L (ref 3.5–5.3)
SODIUM: 139 meq/L (ref 135–145)
TOTAL PROTEIN: 7.1 g/dL (ref 6.0–8.3)
Total Bilirubin: 0.4 mg/dL (ref 0.2–1.2)

## 2015-01-21 LAB — LIPID PANEL
CHOLESTEROL: 213 mg/dL — AB (ref 0–200)
HDL: 45 mg/dL (ref >=40)
LDL CALC: 155 mg/dL — AB (ref 0–99)
TRIGLYCERIDES: 65 mg/dL (ref <150)
Total CHOL/HDL Ratio: 4.7 Ratio
VLDL: 13 mg/dL (ref 0–40)

## 2015-01-21 LAB — CBC WITH DIFFERENTIAL/PLATELET
BASOS ABS: 0.1 10*3/uL (ref 0.0–0.1)
Basophils Relative: 1 % (ref 0–1)
EOS ABS: 0.2 10*3/uL (ref 0.0–0.7)
EOS PCT: 4 % (ref 0–5)
HCT: 40 % (ref 39.0–52.0)
Hemoglobin: 13.4 g/dL (ref 13.0–17.0)
LYMPHS PCT: 36 % (ref 12–46)
Lymphs Abs: 2.2 10*3/uL (ref 0.7–4.0)
MCH: 28.9 pg (ref 26.0–34.0)
MCHC: 33.5 g/dL (ref 30.0–36.0)
MCV: 86.4 fL (ref 78.0–100.0)
MPV: 10.9 fL (ref 8.6–12.4)
Monocytes Absolute: 0.5 10*3/uL (ref 0.1–1.0)
Monocytes Relative: 8 % (ref 3–12)
NEUTROS ABS: 3.1 10*3/uL (ref 1.7–7.7)
Neutrophils Relative %: 51 % (ref 43–77)
PLATELETS: 218 10*3/uL (ref 150–400)
RBC: 4.63 MIL/uL (ref 4.22–5.81)
RDW: 14.1 % (ref 11.5–15.5)
WBC: 6 10*3/uL (ref 4.0–10.5)

## 2015-01-21 LAB — TSH: TSH: 1.266 u[IU]/mL (ref 0.350–4.500)

## 2015-01-28 ENCOUNTER — Encounter: Payer: Self-pay | Admitting: Family Medicine

## 2015-01-28 ENCOUNTER — Ambulatory Visit (INDEPENDENT_AMBULATORY_CARE_PROVIDER_SITE_OTHER): Payer: Medicaid Other | Admitting: Family Medicine

## 2015-01-28 VITALS — BP 130/70 | HR 84 | Temp 97.4°F | Resp 16 | Ht 73.0 in | Wt 242.0 lb

## 2015-01-28 DIAGNOSIS — Z0000 Encounter for general adult medical examination without abnormal findings: Secondary | ICD-10-CM

## 2015-01-28 DIAGNOSIS — G43009 Migraine without aura, not intractable, without status migrainosus: Secondary | ICD-10-CM | POA: Diagnosis not present

## 2015-01-28 DIAGNOSIS — M222X1 Patellofemoral disorders, right knee: Secondary | ICD-10-CM

## 2015-01-28 MED ORDER — TIZANIDINE HCL 4 MG PO CAPS: 4.0000 mg | ORAL_CAPSULE | Freq: Three times a day (TID) | ORAL | Status: DC | PRN

## 2015-01-28 NOTE — Progress Notes (Signed)
Subjective:    Patient ID: Samuel Reynolds, male    DOB: 10-19-1986, 29 y.o.   MRN: [id number]  HPI Patient is here today for complete physical exam. He also has 2 other concerns. He has been having a constant frontal headache for the last week. It is associated with photophobia and phonophobia. He also reports occasional nausea. The patient states that his mother has migraines. Patient denies any head trauma or injuries. Pain does not radiate. He denies any neurologic changes. He denies any fevers or chills. Patient also complains of pain in his right knee. The pain is located under his patella. His knee feels unstable when he is walking up hills. He also reports sharp pain under his kneecap when he is walking up and down hills. He denies any falls or injuries. He denies any dislocation of the patella with activity. Past Medical History  Diagnosis Date  . Allergy     seasonal  . Bipolar 1 disorder   . Anemia   . Hypertension   . Seizure disorder 1994, 2001  . Dyslipidemia     Per patient report  . H/O syncope     Since teenager. Also vasovagal with blood draws.   Past Surgical History  Procedure Laterality Date  . Exercise tolerance test  07/21/2013    [facility] protocol: 9:35mn, 10.5 METs; HR from 90 to-171 (87% max predicted); BP 125/84 mmHg --> 159/65 mmHg; no arrhythmia or ectopy, no ischemic ECG changes --NEGATIVE GXT, mildly delayed recovery time  . Cardiac monitor  9/3-10/2 2014    NSR, sinus tachycardia to 104-110 bpm.;  No PAC/PVC or arrhythmias.   Current Outpatient Prescriptions on File Prior to Visit  Medication Sig Dispense Refill  . acetaminophen-codeine (TYLENOL #3) 300-30 MG per tablet Take 1 tablet by mouth every 4 (four) hours as needed for moderate pain. 20 tablet 0  . sulfamethoxazole-trimethoprim (BACTRIM DS,SEPTRA DS) 800-160 MG per tablet Take 1 tablet by mouth 2 (two) times daily. 20 tablet 0   No current facility-administered medications on file prior to visit.     Allergies  Allergen Reactions  . Hydrocodone Hives and Nausea And Vomiting  . Benadryl [Diphenhydramine Hcl] Rash   History   Social History  . Marital Status: Legally Separated    Spouse Name: N/A  . Number of Children: N/A  . Years of Education: N/A   Occupational History  . Not on file.   Social History Main Topics  . Smoking status: Never Smoker   . Smokeless tobacco: Never Used  . Alcohol Use: No  . Drug Use: No  . Sexual Activity: Not on file   Other Topics Concern  . Not on file   Social History Narrative   Married father of one. Lives with his wife and daughter as well as his mother-in-law and sister-in-law and nephew. He drinks occasional alcohol, but never smoked or use of tobacco products. Does not exercise because he "does not feel like he has the time her energy.   Family History  Problem Relation Age of Onset  . Hypertension Mother   . Hyperlipidemia Mother   . Hyperlipidemia Father   . Hypertension Father   . Cancer Mother   . Hyperlipidemia Maternal Grandmother   . Hypertension Maternal Grandmother   . Diabetes Maternal Grandmother   . Hyperlipidemia Maternal Grandfather   . Hypertension Maternal Grandfather   . Hypertension Paternal Grandmother   . Hyperlipidemia Paternal Grandmother   . Stroke Maternal Grandfather   .  Cancer Paternal Grandmother   . Sudden death Paternal Grandfather   . Hypertension Paternal Grandfather   . Hyperlipidemia Paternal Grandfather   . Hyperlipidemia Sister   . Hypertension Sister   . Heart disease      Reportedly his father had PCI at age 7, this is not confirmed on the patient's report      Review of Systems  All other systems reviewed and are negative.      Objective:   Physical Exam  Constitutional: He is oriented to person, place, and time. He appears well-developed and well-nourished. No distress.  HENT:  Head: Normocephalic and atraumatic.  Right Ear: External ear normal.  Left Ear: External  ear normal.  Nose: Nose normal.  Mouth/Throat: Oropharynx is clear and moist. No oropharyngeal exudate.  Eyes: Conjunctivae and EOM are normal. Pupils are equal, round, and reactive to light. Right eye exhibits no discharge. Left eye exhibits no discharge. No scleral icterus.  Neck: Normal range of motion. Neck supple. No JVD present. No tracheal deviation present. No thyromegaly present.  Cardiovascular: Normal rate, regular rhythm, normal heart sounds and intact distal pulses.  Exam reveals no gallop and no friction rub.   No murmur heard. Pulmonary/Chest: Effort normal and breath sounds normal. No stridor. No respiratory distress. He has no wheezes. He has no rales. He exhibits no tenderness.  Abdominal: Soft. Bowel sounds are normal. He exhibits no distension and no mass. There is no tenderness. There is no rebound and no guarding.  Musculoskeletal: Normal range of motion. He exhibits no edema or tenderness.  Lymphadenopathy:    He has no cervical adenopathy.  Neurological: He is alert and oriented to person, place, and time. He has normal reflexes. He displays normal reflexes. No cranial nerve deficit. He exhibits normal muscle tone. Coordination normal.  Skin: Skin is warm. No rash noted. He is not diaphoretic. No erythema. No pallor.  Psychiatric: He has a normal mood and affect. His behavior is normal. Judgment and thought content normal.  Vitals reviewed.   Appointment on 01/20/2015  Component Date Value Ref Range Status  . Sodium 01/20/2015 139  135 - 145 mEq/L Final  . Potassium 01/20/2015 4.3  3.5 - 5.3 mEq/L Final  . Chloride 01/20/2015 103  96 - 112 mEq/L Final  . CO2 01/20/2015 24  19 - 32 mEq/L Final  . Glucose, Bld 01/20/2015 91  70 - 99 mg/dL Final  . BUN 01/20/2015 12  6 - 23 mg/dL Final  . Creat 01/20/2015 0.94  0.50 - 1.35 mg/dL Final  . Total Bilirubin 01/20/2015 0.4  0.2 - 1.2 mg/dL Final  . Alkaline Phosphatase 01/20/2015 74  39 - 117 U/L Final  . AST 01/20/2015  22  0 - 37 U/L Final  . ALT 01/20/2015 39  0 - 53 U/L Final  . Total Protein 01/20/2015 7.1  6.0 - 8.3 g/dL Final  . Albumin 01/20/2015 4.4  3.5 - 5.2 g/dL Final  . Calcium 01/20/2015 9.3  8.4 - 10.5 mg/dL Final  . GFR, Est African American 01/20/2015 >89   Final  . GFR, Est Non African American 01/20/2015 >89   Final   Comment:   The estimated GFR is a calculation valid for adults (>=46 years old) that uses the CKD-EPI algorithm to adjust for age and sex. It is   not to be used for children, pregnant women, hospitalized patients,    patients on dialysis, or with rapidly changing kidney function. According to the NKDEP, eGFR >  89 is normal, 60-89 shows mild impairment, 30-59 shows moderate impairment, 15-29 shows severe impairment and <15 is ESRD.     . TSH 01/20/2015 1.266  0.350 - 4.500 uIU/mL Final  . Cholesterol 01/20/2015 213* 0 - 200 mg/dL Final   Comment: ATP III Classification:       < 200        mg/dL        Desirable      200 - 239     mg/dL        Borderline High      >= 240        mg/dL        High     . Triglycerides 01/20/2015 65  <150 mg/dL Final  . HDL 01/20/2015 45  >=40 mg/dL Final   ** Please note change in reference range(s). **  . Total CHOL/HDL Ratio 01/20/2015 4.7   Final  . VLDL 01/20/2015 13  0 - 40 mg/dL Final  . LDL Cholesterol 01/20/2015 155* 0 - 99 mg/dL Final   Comment:   Total Cholesterol/HDL Ratio:CHD Risk                        Coronary Heart Disease Risk Table                                        Men       Women          1/2 Average Risk              3.4        3.3              Average Risk              5.0        4.4           2X Average Risk              9.6        7.1           3X Average Risk             23.4       11.0 Use the calculated Patient Ratio above and the CHD Risk table  to determine the patient's CHD Risk. ATP III Classification (LDL):       < 100        mg/dL         Optimal      100 - 129     mg/dL         Near or Above  Optimal      130 - 159     mg/dL         Borderline High      160 - 189     mg/dL         High       > 190        mg/dL         Very High     . WBC 01/20/2015 6.0  4.0 - 10.5 K/uL Final  . RBC 01/20/2015 4.63  4.22 - 5.81 MIL/uL Final  . Hemoglobin 01/20/2015 13.4  13.0 - 17.0 g/dL Final  . HCT 01/20/2015 40.0  39.0 - 52.0 % Final  . MCV 01/20/2015 86.4  78.0 - 100.0 fL Final  . MCH 01/20/2015 28.9  26.0 - 34.0 pg Final  . MCHC 01/20/2015 33.5  30.0 - 36.0 g/dL Final  . RDW 01/20/2015 14.1  11.5 - 15.5 % Final  . Platelets 01/20/2015 218  150 - 400 K/uL Final  . MPV 01/20/2015 10.9  8.6 - 12.4 fL Final  . Neutrophils Relative % 01/20/2015 51  43 - 77 % Final  . Neutro Abs 01/20/2015 3.1  1.7 - 7.7 K/uL Final  . Lymphocytes Relative 01/20/2015 36  12 - 46 % Final  . Lymphs Abs 01/20/2015 2.2  0.7 - 4.0 K/uL Final  . Monocytes Relative 01/20/2015 8  3 - 12 % Final  . Monocytes Absolute 01/20/2015 0.5  0.1 - 1.0 K/uL Final  . Eosinophils Relative 01/20/2015 4  0 - 5 % Final  . Eosinophils Absolute 01/20/2015 0.2  0.0 - 0.7 K/uL Final  . Basophils Relative 01/20/2015 1  0 - 1 % Final  . Basophils Absolute 01/20/2015 0.1  0.0 - 0.1 K/uL Final  . Smear Review 01/20/2015 Criteria for review not met   Final  Office Visit on 01/03/2015  Component Date Value Ref Range Status  . Culture 01/03/2015 Moderate STAPHYLOCOCCUS AUREUS   Final  . Gram Stain 01/03/2015 No WBC Seen   Final  . Gram Stain 01/03/2015 No Squamous Epithelial Cells Seen   Final  . Gram Stain 01/03/2015 Rare Gram Positive Cocci In Pairs In Clusters   Final  . Organism ID, Bacteria 01/03/2015 STAPHYLOCOCCUS AUREUS   Final   Comment: Rifampin and Gentamicin should not be used as single drugs for treatment of Staph infections. This organism DOES NOT demonstrate inducible Clindamycin resistance in vitro.          Assessment & Plan:  Nonintractable migraine, unspecified migraine type - Plan: tiZANidine (ZANAFLEX) 4 MG  capsule  Patellofemoral syndrome, right  Routine general medical examination at a health care facility  patient's physical exam today is relatively normal. His lab work is significant for hyperlipidemia. However the patient states that he is eating fast food 3 times a day. He is also not exercising. He is not working. I recommended for the patient start exercising 30 minutes a day. I recommended he discontinue his consumption of fast foods, sodas, and sweet tea and try to eat more fresh fruits and vegetables. I want him to try to increase his physical activity. I believe the patient's headache is likely a migraine. I gave the patient Zanaflex 4 mg every 8 hours as needed for headache. He's been using ibuprofen with minimal relief. I believe the patient's knee pain is due to patellofemoral syndrome. I gave the patient a handout of exercises to perform at home to try to improve his medial quadriceps and help strengthen the muscles around his need to increase his knee stability. The remainder of his physical exam is normal. Recheck his cholesterol in 6 months.

## 2015-01-28 NOTE — Addendum Note (Signed)
Addended by: Legrand RamsWILLIS, Batul Diego B on: 01/28/2015 04:37 PM   Modules accepted: Orders, Medications

## 2015-03-17 ENCOUNTER — Ambulatory Visit (INDEPENDENT_AMBULATORY_CARE_PROVIDER_SITE_OTHER): Payer: Medicaid Other | Admitting: Family Medicine

## 2015-03-17 ENCOUNTER — Encounter: Payer: Self-pay | Admitting: Family Medicine

## 2015-03-17 VITALS — BP 130/70 | HR 100 | Temp 97.8°F | Resp 18 | Wt 238.0 lb

## 2015-03-17 DIAGNOSIS — J309 Allergic rhinitis, unspecified: Secondary | ICD-10-CM | POA: Diagnosis not present

## 2015-03-17 DIAGNOSIS — L732 Hidradenitis suppurativa: Secondary | ICD-10-CM

## 2015-03-17 DIAGNOSIS — J029 Acute pharyngitis, unspecified: Secondary | ICD-10-CM | POA: Diagnosis not present

## 2015-03-17 LAB — RAPID STREP SCREEN (MED CTR MEBANE ONLY): Streptococcus, Group A Screen (Direct): NEGATIVE

## 2015-03-17 MED ORDER — FLUTICASONE PROPIONATE 50 MCG/ACT NA SUSP: 2.0000 | Freq: Every day | NASAL | Status: DC

## 2015-03-17 MED ORDER — METHYLPREDNISOLONE ACETATE 40 MG/ML IJ SUSP
60.0000 mg | Freq: Once | INTRAMUSCULAR | Status: AC
Start: 2015-03-17 — End: 2015-03-17
  Administered 2015-03-17: 60 mg via INTRAMUSCULAR

## 2015-03-17 NOTE — Progress Notes (Signed)
Subjective:    Patient ID: Samuel ReiningRandy N Terrill, male    DOB: [birth year], 29 y.o.   MRN: [id number]  HPI Patient symptoms began 48 hours ago. He reports a sinus headache, rhinorrhea, postnasal drip, sore scratchy throat. He denies any fever. He reports rhinorrhea and itchy watery eyes. He has a history of allergies. He is taking over-the-counter allergy medication without relief. He denies any sinus pain. He denies any pain in his teeth. Examination of his posterior oropharynx today reveals no erythema. Strep screen is negative. He has no significant tenderness to palpation over his sinuses. He does have copious rhinorrhea and swollen nasal mucosa. He also complains of a frontal sinus headache Past Medical History  Diagnosis Date  . Allergy     seasonal  . Bipolar 1 disorder   . Anemia   . Hypertension   . Seizure disorder 1994, 2001  . Dyslipidemia     Per patient report  . H/O syncope     Since teenager. Also vasovagal with blood draws.   Past Surgical History  Procedure Laterality Date  . Exercise tolerance test  07/21/2013    [facility] protocol: 9:3119min, 10.5 METs; HR from 90 to-171 (87% max predicted); BP 125/84 mmHg --> 159/65 mmHg; no arrhythmia or ectopy, no ischemic ECG changes --NEGATIVE GXT, mildly delayed recovery time  . Cardiac monitor  9/3-10/2 2014    NSR, sinus tachycardia to 104-110 bpm.;  No PAC/PVC or arrhythmias.   Current Outpatient Prescriptions on File Prior to Visit  Medication Sig Dispense Refill  . acetaminophen-codeine (TYLENOL #3) 300-30 MG per tablet Take 1 tablet by mouth every 4 (four) hours as needed for moderate pain. 20 tablet 0  . tiZANidine (ZANAFLEX) 4 MG tablet Take 4 mg by mouth every 4 (four) hours as needed (prn headaches).     No current facility-administered medications on file prior to visit.   Allergies  Allergen Reactions  . Hydrocodone Hives and Nausea And Vomiting  . Benadryl [Diphenhydramine Hcl] Rash   History   Social History  .  Marital Status: Legally Separated    Spouse Name: N/A  . Number of Children: N/A  . Years of Education: N/A   Occupational History  . Not on file.   Social History Main Topics  . Smoking status: Never Smoker   . Smokeless tobacco: Never Used  . Alcohol Use: No  . Drug Use: No  . Sexual Activity: Not on file   Other Topics Concern  . Not on file   Social History Narrative   Married father of one. Lives with his wife and daughter as well as his mother-in-law and sister-in-law and nephew. He drinks occasional alcohol, but never smoked or use of tobacco products. Does not exercise because he "does not feel like he has the time her energy.      Review of Systems  All other systems reviewed and are negative.      Objective:   Physical Exam  Constitutional: He appears well-developed and well-nourished. No distress.  HENT:  Head: Normocephalic and atraumatic.  Right Ear: Tympanic membrane, external ear and ear canal normal.  Left Ear: Tympanic membrane, external ear and ear canal normal.  Nose: Mucosal edema and rhinorrhea present. Right sinus exhibits frontal sinus tenderness.  Mouth/Throat: Oropharynx is clear and moist.  Eyes: Conjunctivae and EOM are normal. Pupils are equal, round, and reactive to light.  Neck: Neck supple.  Cardiovascular: Normal rate, regular rhythm and normal heart sounds.   Pulmonary/Chest: Effort  normal and breath sounds normal. No respiratory distress. He has no wheezes. He has no rales.  Lymphadenopathy:    He has no cervical adenopathy.  Skin: He is not diaphoretic.  Vitals reviewed.         Assessment & Plan:  Sore throat - Plan: Rapid Strep Screen, CANCELED: Rapid strep screen (not at [facility])  Allergic sinusitis - Plan: fluticasone (FLONASE) 50 MCG/ACT nasal spray  Hidradenitis suppurativa - Plan: Ambulatory referral to Dermatology  I believe the patient's symptoms are primarily allergy related. I recommend he continue his  over-the-counter allergy medication. I will supplement with Flonase 2 sprays each nostril daily. I will jumpstart his recovery with 60 mg of Depo-Medrol IM 1. Recheck immediately if the patient develops fevers or signs of an infection or symptoms worsen

## 2015-03-17 NOTE — Addendum Note (Signed)
Addended by: Phillips OdorSIX, CHRISTINA H on: 03/17/2015 03:54 PM   Modules accepted: Orders

## 2015-03-18 ENCOUNTER — Telehealth: Payer: Self-pay | Admitting: *Deleted

## 2015-03-18 NOTE — Telephone Encounter (Signed)
[facility] Dr. Karlyn Ageean Jones on July 11 at 9:20am pt needs to arrive at 9:00am for register. LMTRC to pt

## 2015-03-22 IMAGING — CR DG THORACIC SPINE 2V
3 series · 3 of 3 positions shown · non-contrast
Comparison: Chest 04/08/2014

CLINICAL DATA: Fell off a trailer landing on the back. Pain in the
upper and lower back and neck.

EXAM:
THORACIC SPINE - 2 VIEW

[t thoracic spine ap]
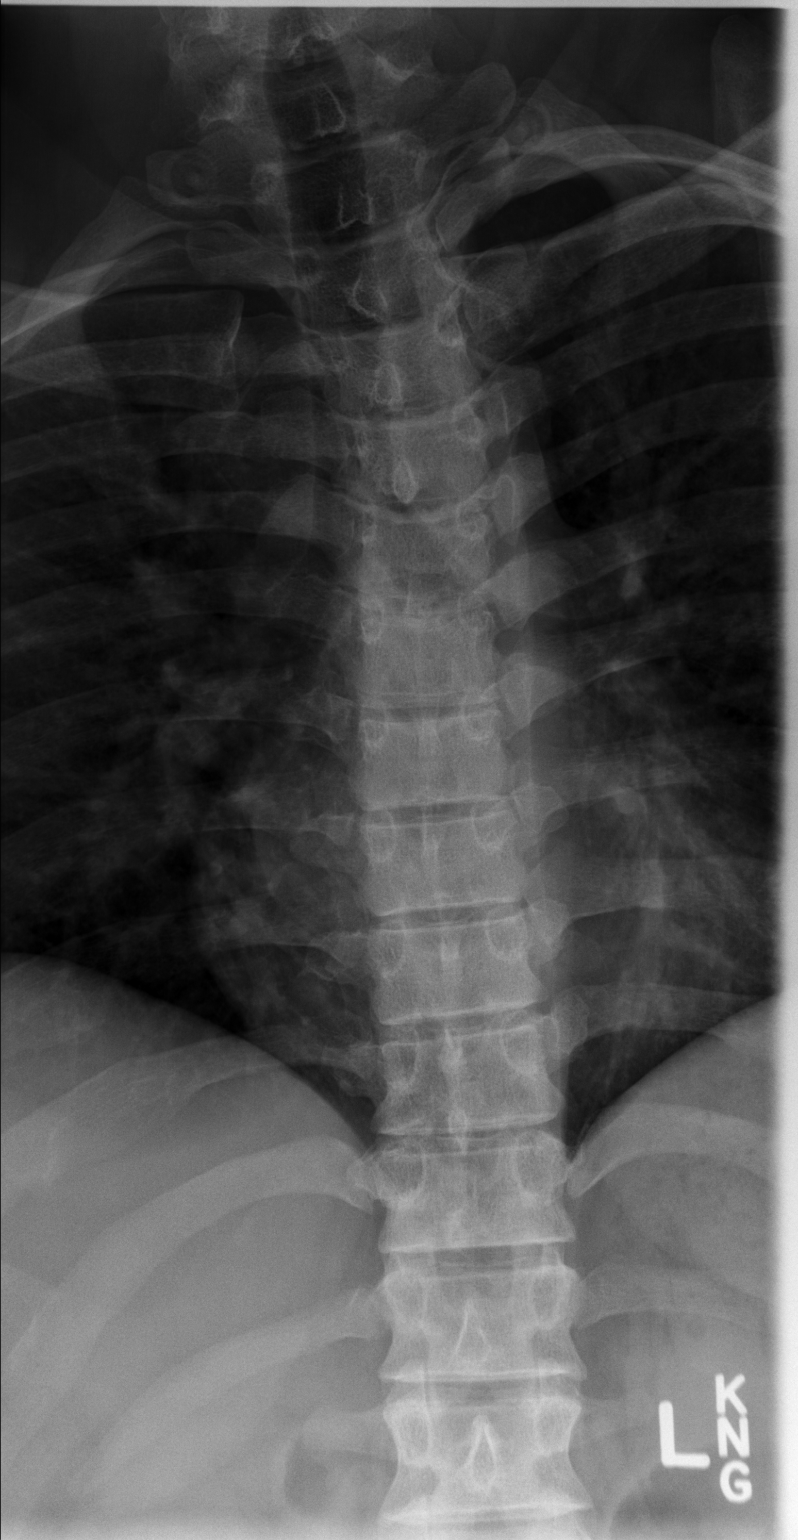

[t thoracic spine lat]
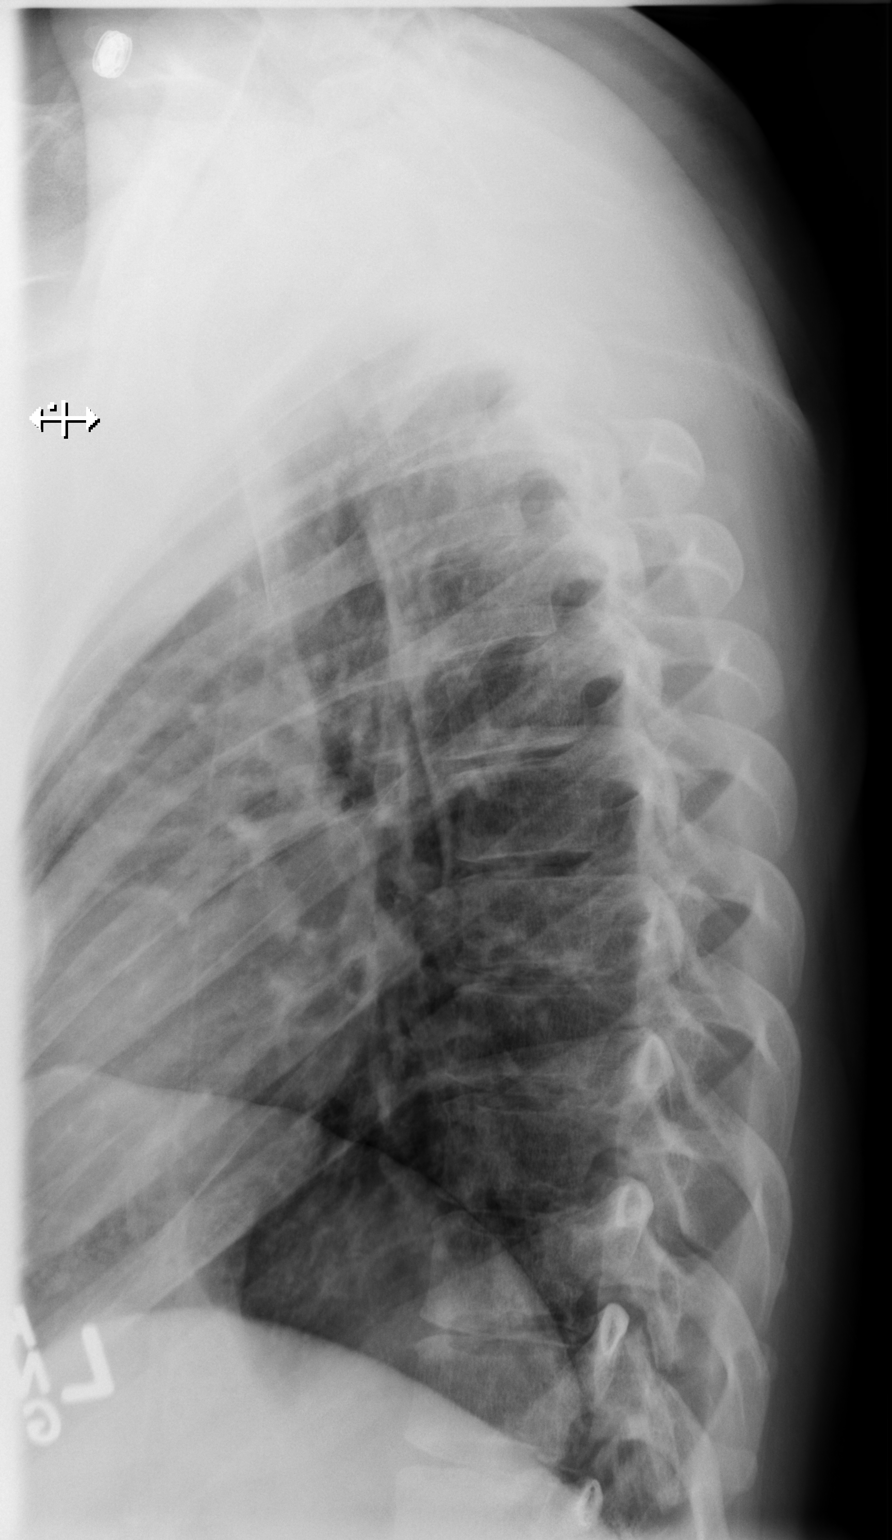

[t thoracic swimmers]
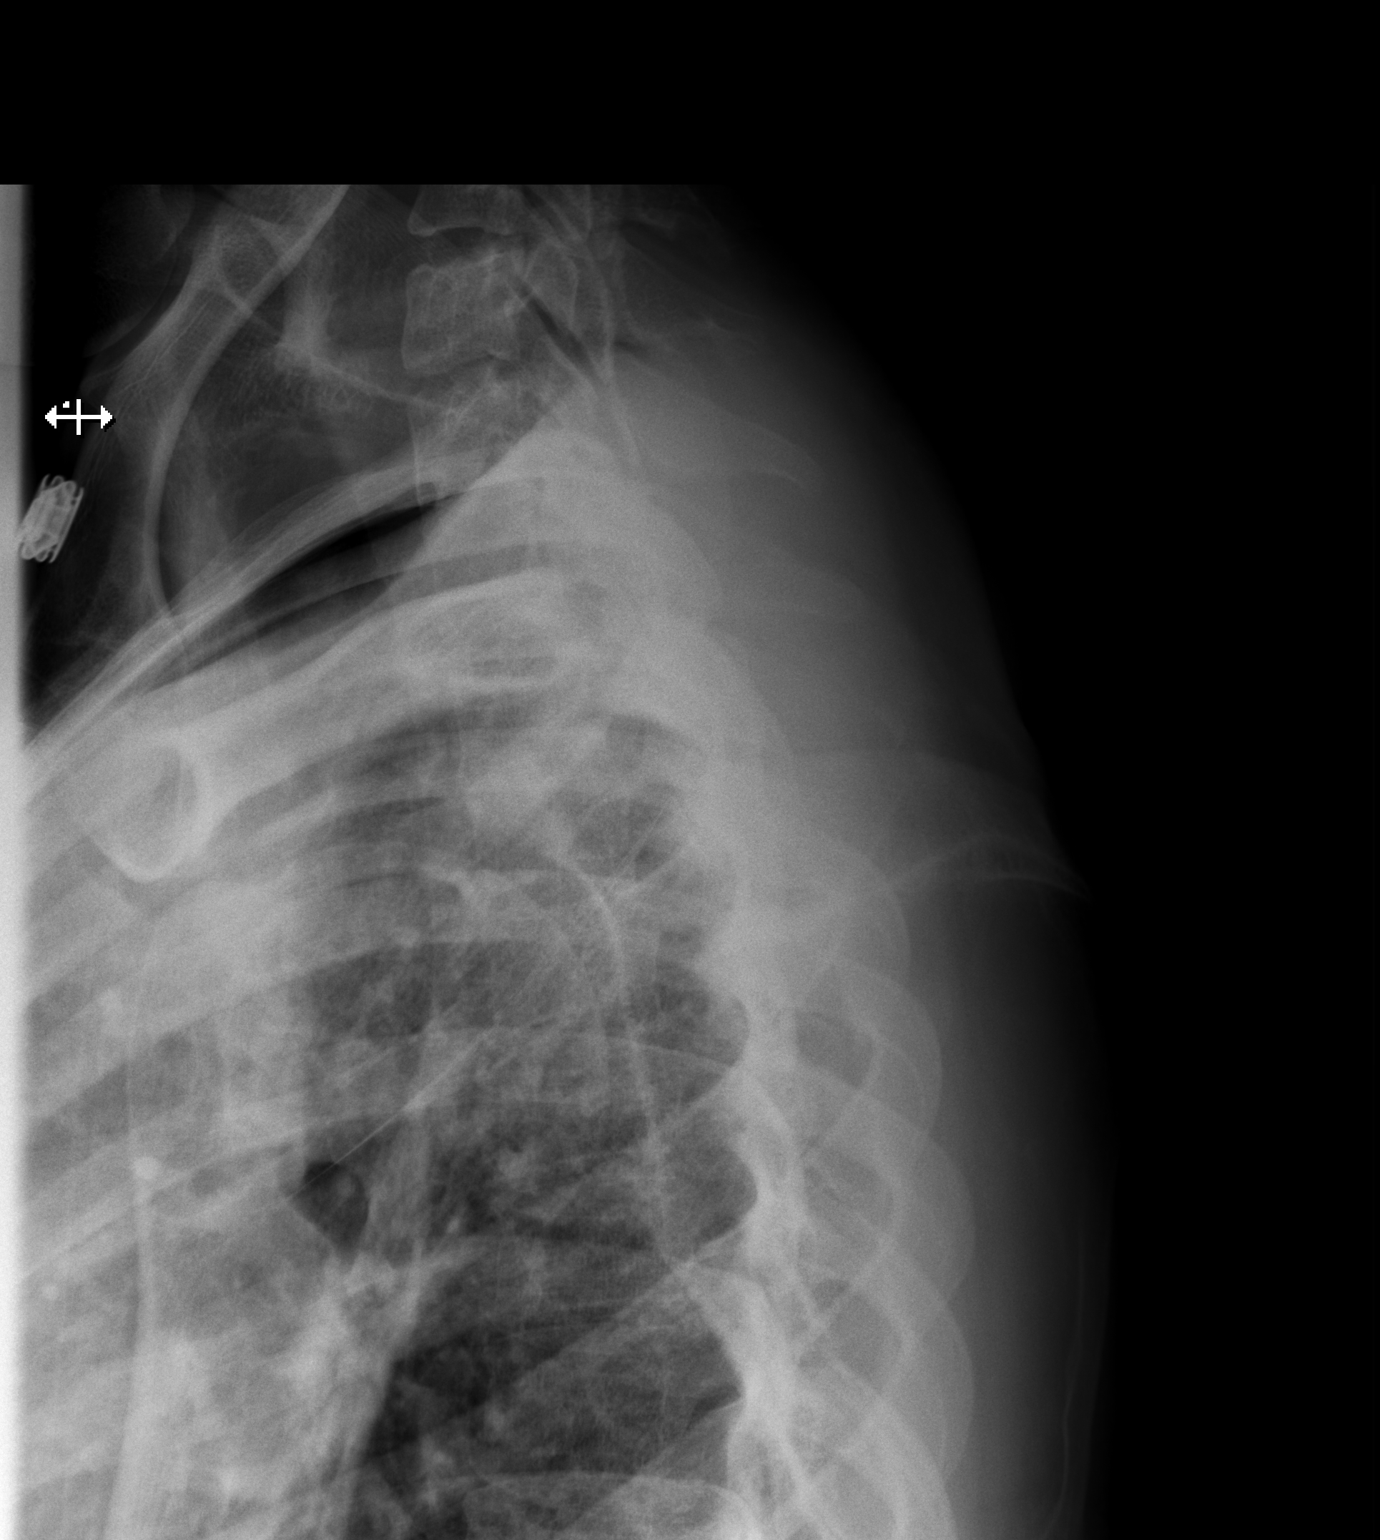

[3 of 3 positions shown; findings below may reference images not displayed]

FINDINGS: There is no evidence of thoracic spine fracture. Alignment is
normal. No other significant bone abnormalities are identified.
IMPRESSION: Negative.

## 2015-03-22 IMAGING — CT CT HEAD W/O CM
3 of 4 series · 14 of 30 positions shown, 17 images · non-contrast
Comparison: None.

CLINICAL DATA: Severe headache after fall from reconstruction
trailer. Mid and lower back pain. Numbness in the right arm.

EXAM:
CT HEAD WITHOUT CONTRAST
CT CERVICAL SPINE WITHOUT CONTRAST
TECHNIQUE: Multidetector CT imaging of the head and cervical spine was
performed following the standard protocol without intravenous
contrast. Multiplanar CT image reconstructions of the cervical spine
were also generated.

[Series 3: head w/o · axial · non-contrast · 0.43mm/px · z∈[-137,-87]mm · 2 of 30 slices shown]
[im 10/30  brain]
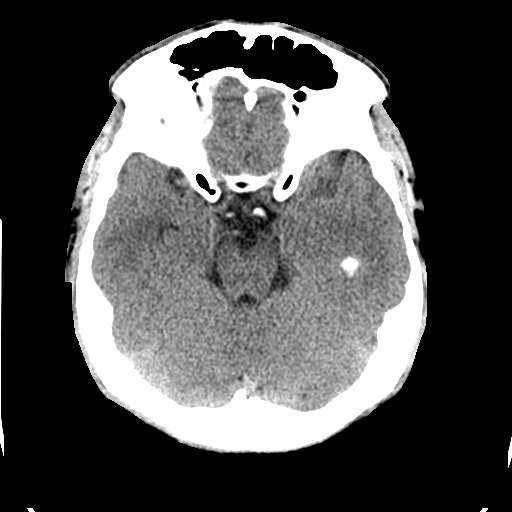
[im 20/30  brain]
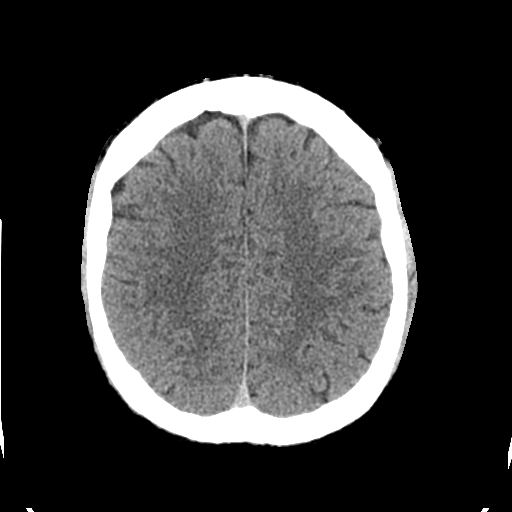

[Series 4: bone windows · axial · 0.43mm/px · z∈[-146,-74]mm · 3 of 49 slices shown]
[im 13/49  bone]
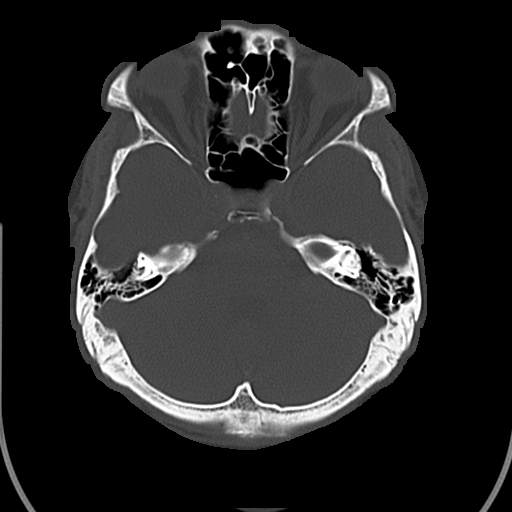
[im 25/49  bone]
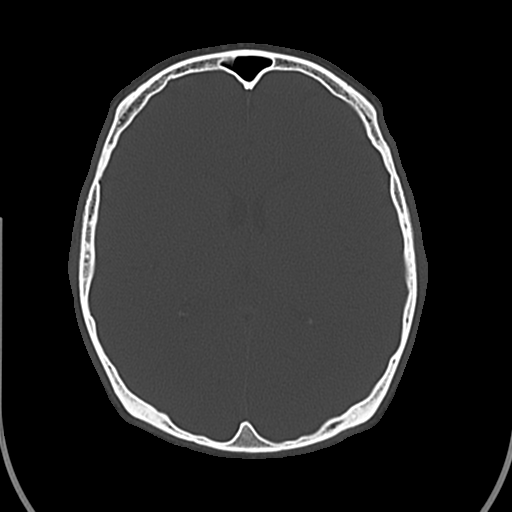
[im 37/49  bone]
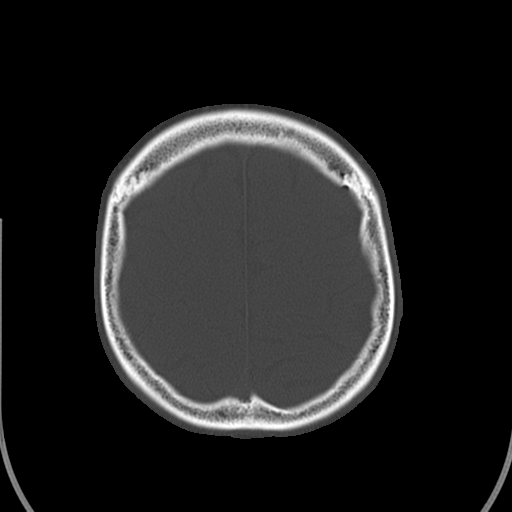

[Series 8: axial recon · axial · 0.23mm/px · z∈[-376,-211]mm · 9 of 105 slices shown, 12 images]
[im 11/105  brain]
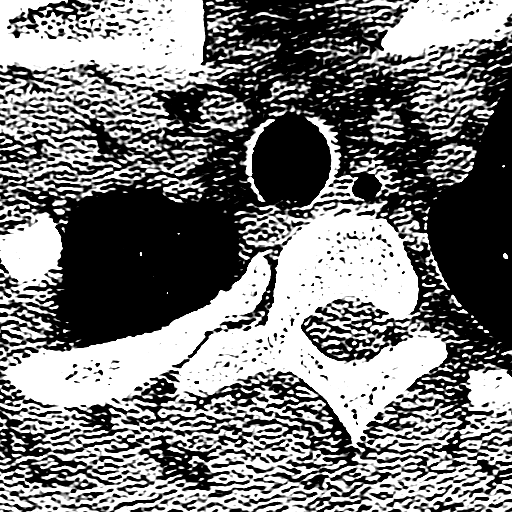
[im 11/105  bone]
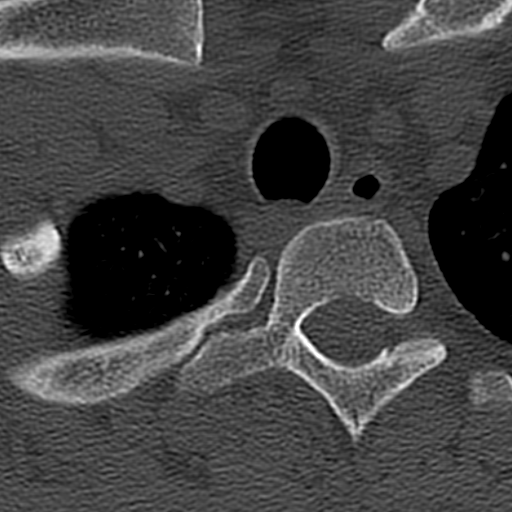
[im 21/105  brain]
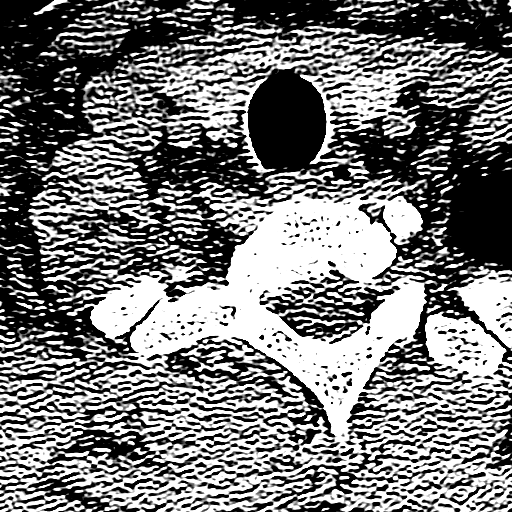
[im 32/105  brain]
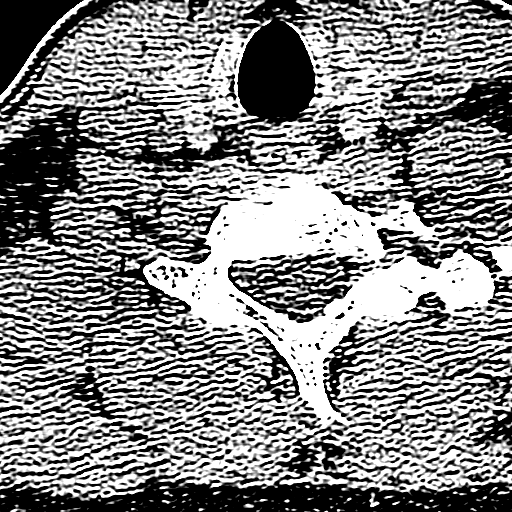
[im 42/105  brain]
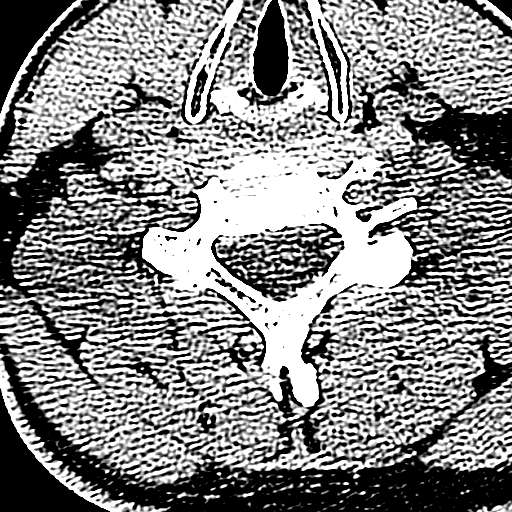
[im 53/105  brain]
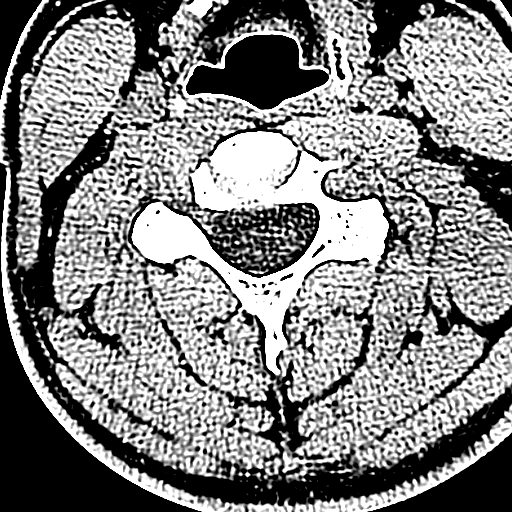
[im 53/105  bone]
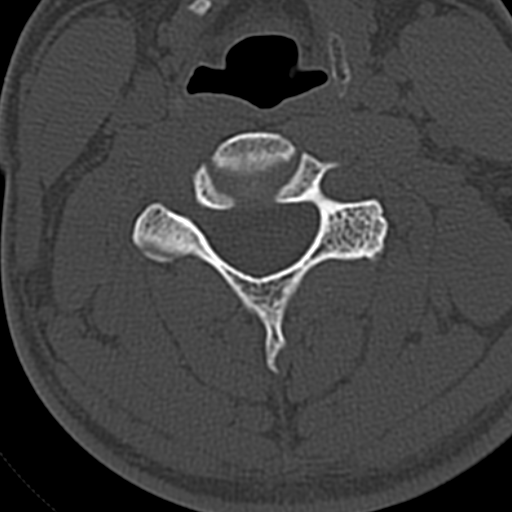
[im 63/105  brain]
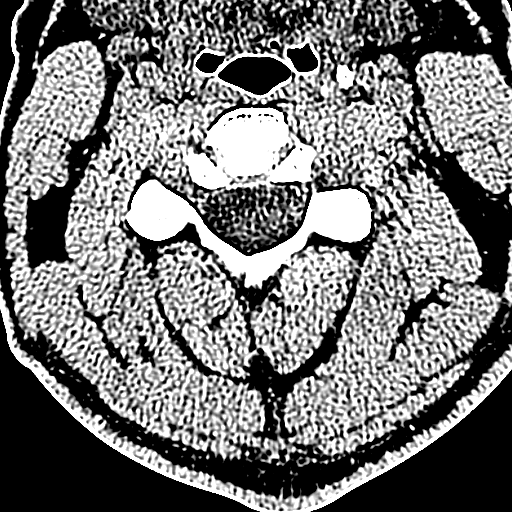
[im 73/105  brain]
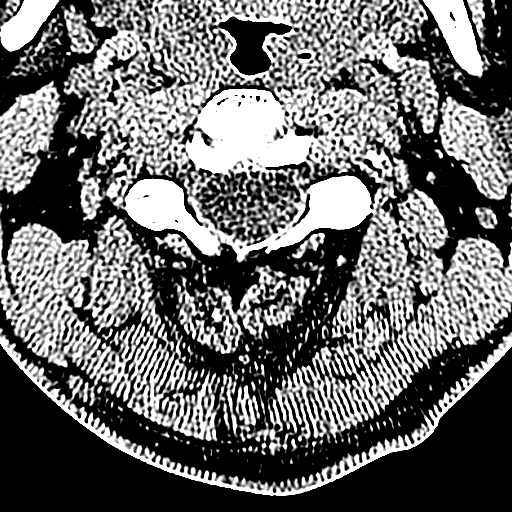
[im 84/105  brain]
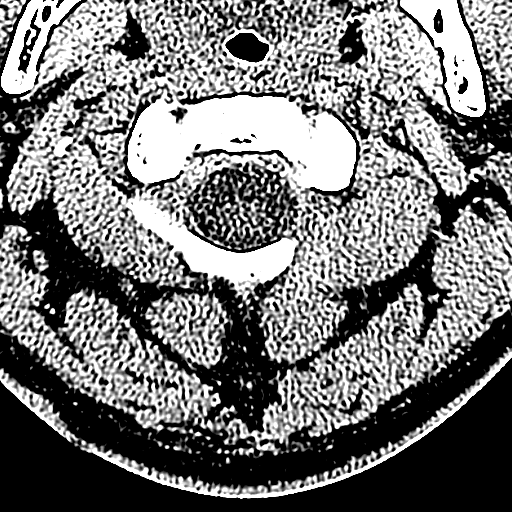
[im 94/105  brain]
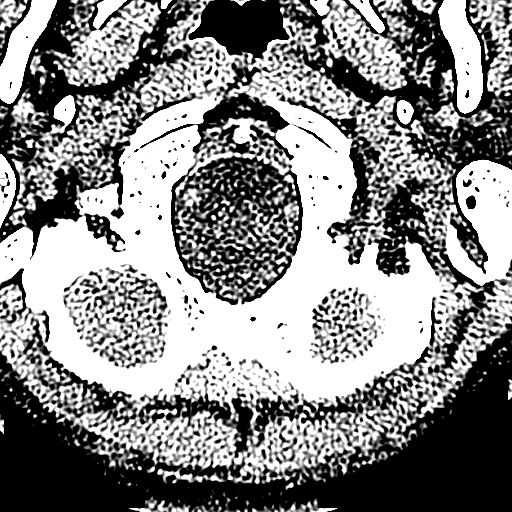
[im 94/105  bone]
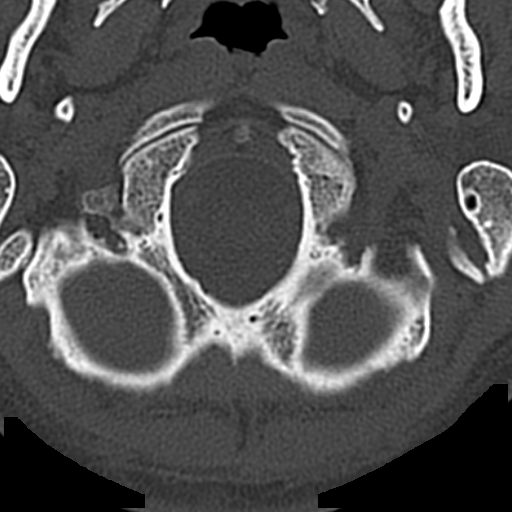

[14 of 30 positions shown; findings below may reference images not displayed]

FINDINGS: CT HEAD FINDINGS

Ventricles and sulci appear symmetrical. No mass effect or midline
shift. No abnormal extra-axial fluid collections. Gray-white matter
junctions are distinct. Basal cisterns are not effaced. No evidence
of acute intracranial hemorrhage. No depressed skull fractures.
Retention cyst in the sphenoid sinus. Mastoid air cells are not
opacified.

CT CERVICAL SPINE FINDINGS

Normal alignment of the cervical spine and facet joints. No
vertebral compression deformities. Intervertebral disc space heights
are preserved. No prevertebral soft tissue swelling. C1-2
articulation appears intact. Mild convexity of the cervical thoracic
junction is likely positional although muscle spasm could have this
appearance. No focal bone lesion or bone destruction. Bone cortex
and trabecular architecture appear intact. Soft tissues are
unremarkable.
IMPRESSION: No acute intracranial abnormalities. Normal alignment of the
cervical spine. No displaced fractures identified.

## 2015-03-22 NOTE — Telephone Encounter (Signed)
Pt called back and aware of appt to Dermatology 

## 2015-07-29 ENCOUNTER — Ambulatory Visit: Payer: Medicaid Other | Admitting: Family Medicine

## 2015-10-18 ENCOUNTER — Ambulatory Visit (INDEPENDENT_AMBULATORY_CARE_PROVIDER_SITE_OTHER): Payer: Medicaid Other | Admitting: Family Medicine

## 2015-10-18 ENCOUNTER — Encounter: Payer: Self-pay | Admitting: Family Medicine

## 2015-10-18 VITALS — BP 140/68 | HR 88 | Temp 98.7°F | Resp 16 | Ht 73.0 in | Wt 241.0 lb

## 2015-10-18 DIAGNOSIS — F39 Unspecified mood [affective] disorder: Secondary | ICD-10-CM

## 2015-10-18 DIAGNOSIS — R4586 Emotional lability: Secondary | ICD-10-CM

## 2015-10-18 MED ORDER — DIVALPROEX SODIUM 500 MG PO DR TAB: 500.0000 mg | DELAYED_RELEASE_TABLET | Freq: Two times a day (BID) | ORAL | Status: DC

## 2015-10-18 NOTE — Progress Notes (Signed)
Subjective:    Patient ID: Samuel Reynolds, male    DOB: June 11, 1986, 29 y.o.   MRN: [id number]  HPI I have seen this patient several times in the past for rapid mood swings, anger management issues, and bipolar tendencies. He has never had true mania at least that I have seen. He denies insomnia. He denies delusional behavior. He denies drug abuse. He denies hypersexuality. However he does have rapid mood swings. He can go from being happy to extremely violent and angry in a is diffuse brief moment.  He has lost several jobs over the last year because of his mood swings and impulsive behavior and he is here today with his partner who states that he will frequently loses his temper and throat cans filled with fluid at her head and basically no reason. He denies suicidal ideation. He denies homicidal ideation. He reports having no patience and being quick to violent outburst. In the past and try the patient on Seroquel and Abilify on which made him hyper somnolent. In 2014 I'll try the patient on Depakote which seemed to work well. He only stopped the medication because he felt like he didn't need it anymore. Past Medical History  Diagnosis Date  . Allergy     seasonal  . Bipolar 1 disorder (HCC)   . Anemia   . Hypertension   . Seizure disorder (HCC) 1994, 2001  . Dyslipidemia     Per patient report  . H/O syncope     Since teenager. Also vasovagal with blood draws.   Past Surgical History  Procedure Laterality Date  . Exercise tolerance test  07/21/2013    [facility] protocol: 9:56min, 10.5 METs; HR from 90 to-171 (87% max predicted); BP 125/84 mmHg --> 159/65 mmHg; no arrhythmia or ectopy, no ischemic ECG changes --NEGATIVE GXT, mildly delayed recovery time  . Cardiac monitor  9/3-10/2 2014    NSR, sinus tachycardia to 104-110 bpm.;  No PAC/PVC or arrhythmias.   Current Outpatient Prescriptions on File Prior to Visit  Medication Sig Dispense Refill  . fluticasone (FLONASE) 50 MCG/ACT nasal  spray Place 2 sprays into both nostrils daily. 16 g 6   No current facility-administered medications on file prior to visit.   Allergies  Allergen Reactions  . Hydrocodone Hives and Nausea And Vomiting  . Benadryl [Diphenhydramine Hcl] Rash   Social History   Social History  . Marital Status: Legally Separated    Spouse Name: N/A  . Number of Children: N/A  . Years of Education: N/A   Occupational History  . Not on file.   Social History Main Topics  . Smoking status: Never Smoker   . Smokeless tobacco: Never Used  . Alcohol Use: No  . Drug Use: No  . Sexual Activity: Not on file   Other Topics Concern  . Not on file   Social History Narrative   Married father of one. Lives with his wife and daughter as well as his mother-in-law and sister-in-law and nephew. He drinks occasional alcohol, but never smoked or use of tobacco products. Does not exercise because he "does not feel like he has the time her energy.      Review of Systems  All other systems reviewed and are negative.      Objective:   Physical Exam  Constitutional: He is oriented to person, place, and time.  Cardiovascular: Normal rate, regular rhythm and normal heart sounds.   Pulmonary/Chest: Effort normal and breath sounds normal. No respiratory  distress. He has no wheezes. He has no rales.  Abdominal: Soft. Bowel sounds are normal.  Neurological: He is alert and oriented to person, place, and time. No cranial nerve deficit. He exhibits normal muscle tone. Coordination normal.  Psychiatric: He has a normal mood and affect. His behavior is normal. Judgment and thought content normal.  Vitals reviewed.         Assessment & Plan:  Mood swings (HCC) - Plan: divalproex (DEPAKOTE) 500 MG DR tablet  Given his rapid mood swings, I will treat the patient with Depakote 500 mg by mouth twice a day as a mood stabilizer. However I will seek a second opinion from a psychiatrist.  Patient denies depression or  hallucination or paranoia or delusional behavior. I definitely think he would benefit from anger management.

## 2015-11-18 ENCOUNTER — Encounter: Payer: Self-pay | Admitting: Family Medicine

## 2015-11-18 ENCOUNTER — Ambulatory Visit (INDEPENDENT_AMBULATORY_CARE_PROVIDER_SITE_OTHER): Payer: Medicaid Other | Admitting: Family Medicine

## 2015-11-18 VITALS — BP 132/80 | HR 86 | Temp 97.4°F | Resp 18 | Ht 73.0 in | Wt 242.0 lb

## 2015-11-18 DIAGNOSIS — R4586 Emotional lability: Secondary | ICD-10-CM

## 2015-11-18 DIAGNOSIS — F39 Unspecified mood [affective] disorder: Secondary | ICD-10-CM | POA: Diagnosis not present

## 2015-11-18 DIAGNOSIS — K219 Gastro-esophageal reflux disease without esophagitis: Secondary | ICD-10-CM | POA: Diagnosis not present

## 2015-11-18 MED ORDER — MOMETASONE FUROATE 0.1 % EX CREA: 1.0000 "application " | TOPICAL_CREAM | Freq: Every day | CUTANEOUS | Status: DC

## 2015-11-18 MED ORDER — DIVALPROEX SODIUM ER 500 MG PO TB24: 1000.0000 mg | ORAL_TABLET | Freq: Every day | ORAL | Status: DC

## 2015-11-18 MED ORDER — PANTOPRAZOLE SODIUM 40 MG PO TBEC: 40.0000 mg | DELAYED_RELEASE_TABLET | Freq: Every day | ORAL | Status: DC

## 2015-11-18 NOTE — Progress Notes (Signed)
Subjective:    Patient ID: Samuel Reynolds, male    DOB: 05/05/1986, 30 y.o.   MRN: [id number]  HPI 10/18/15 I have seen this patient several times in the past for rapid mood swings, anger management issues, and bipolar tendencies. He has never had true mania at least that I have seen. He denies insomnia. He denies delusional behavior. He denies drug abuse. He denies hypersexuality. However he does have rapid mood swings. He can go from being happy to extremely violent and angry in a is diffuse brief moment.  He has lost several jobs over the last year because of his mood swings and impulsive behavior and he is here today with his partner who states that he will frequently loses his temper and throat cans filled with fluid at her head and basically no reason. He denies suicidal ideation. He denies homicidal ideation. He reports having no patience and being quick to violent outburst. In the past and try the patient on Seroquel and Abilify on which made him hyper somnolent. In 2014 I'll try the patient on Depakote which seemed to work well. He only stopped the medication because he felt like he didn't need it anymore.  At that time, my plan was: Given his rapid mood swings, I will treat the patient with Depakote 500 mg by mouth twice a day as a mood stabilizer. However I will seek a second opinion from a psychiatrist.  Patient denies depression or hallucination or paranoia or delusional behavior. I definitely think he would benefit from anger management.  11/18/15 Accompanied by his wife.  Mood swings are much better on Depakote.  But the AM dose of depakote leaves him feeling sleepy for 1-2 hours.  Otherwise he is doing well on medication with no side effects. He would like to continue it. He does have a tick bite on the back of his left knee which is itching. There is a quarter size erythematous patch with the patient has been scratching. There is no evidence of erythema migrans. He also reports acid  reflux on a daily basis and would like to resume a PPI medicine that he took in the past which help with his daily heartburn  Past Medical History  Diagnosis Date  . Allergy     seasonal  . Bipolar 1 disorder (HCC)   . Anemia   . Hypertension   . Seizure disorder (HCC) 1994, 2001  . Dyslipidemia     Per patient report  . H/O syncope     Since teenager. Also vasovagal with blood draws.   Past Surgical History  Procedure Laterality Date  . Exercise tolerance test  07/21/2013    [facility]ruce protocol: 9:59min, 10.5 METs; HR from 90 to-171 (87% max predicted); BP 125/84 mmHg --> 159/65 mmHg; no arrhythmia or ectopy, no ischemic ECG changes --NEGATIVE GXT, mildly delayed recovery time  . Cardiac monitor  9/3-10/2 2014    NSR, sinus tachycardia to 104-110 bpm.;  No PAC/PVC or arrhythmias.   Current Outpatient Prescriptions on File Prior to Visit  Medication Sig Dispense Refill  . divalproex (DEPAKOTE) 500 MG DR tablet Take 1 tablet (500 mg total) by mouth 2 (two) times daily. 60 tablet 3  . fluticasone (FLONASE) 50 MCG/ACT nasal spray Place 2 sprays into both nostrils daily. 16 g 6   No current facility-administered medications on file prior to visit.   Allergies  Allergen Reactions  . Hydrocodone Hives and Nausea And Vomiting  . Benadryl [Diphenhydramine Hcl] Rash  Social History   Social History  . Marital Status: Legally Separated    Spouse Name: N/A  . Number of Children: N/A  . Years of Education: N/A   Occupational History  . Not on file.   Social History Main Topics  . Smoking status: Never Smoker   . Smokeless tobacco: Never Used  . Alcohol Use: No  . Drug Use: No  . Sexual Activity: Not on file   Other Topics Concern  . Not on file   Social History Narrative   Married father of one. Lives with his wife and daughter as well as his mother-in-law and sister-in-law and nephew. He drinks occasional alcohol, but never smoked or use of tobacco products. Does not exercise  because he "does not feel like he has the time her energy.      Review of Systems  All other systems reviewed and are negative.      Objective:   Physical Exam  Constitutional: He is oriented to person, place, and time.  Cardiovascular: Normal rate, regular rhythm and normal heart sounds.   Pulmonary/Chest: Effort normal and breath sounds normal. No respiratory distress. He has no wheezes. He has no rales.  Abdominal: Soft. Bowel sounds are normal.  Neurological: He is alert and oriented to person, place, and time. No cranial nerve deficit. He exhibits normal muscle tone. Coordination normal.  Psychiatric: He has a normal mood and affect. His behavior is normal. Judgment and thought content normal.  Vitals reviewed.         Assessment & Plan:  Mood swings (HCC) - Plan: divalproex (DEPAKOTE ER) 500 MG 24 hr tablet  Gastroesophageal reflux disease without esophagitis - Plan: pantoprazole (PROTONIX) 40 MG tablet  Switch medication to Depakote extended release 500 mg tablets. Take 2 tablets at night to try to minimize sedation during the day. Add pantoprazole 40 mg by mouth daily for gastroesophageal reflux disease. He continues Elocon ointment as needed once daily for the next week to help with itching where he was bitten by the tick.  Monitor for signs lLyme disease

## 2016-02-20 ENCOUNTER — Encounter: Payer: Medicaid Other | Admitting: Family Medicine

## 2016-03-06 ENCOUNTER — Encounter: Payer: Self-pay | Admitting: Family Medicine

## 2016-03-06 ENCOUNTER — Ambulatory Visit (INDEPENDENT_AMBULATORY_CARE_PROVIDER_SITE_OTHER): Payer: Medicaid Other | Admitting: Family Medicine

## 2016-03-06 VITALS — BP 160/80 | HR 88 | Temp 97.9°F | Resp 18 | Ht 62.5 in | Wt 247.0 lb

## 2016-03-06 DIAGNOSIS — R030 Elevated blood-pressure reading, without diagnosis of hypertension: Secondary | ICD-10-CM

## 2016-03-06 DIAGNOSIS — IMO0001 Reserved for inherently not codable concepts without codable children: Secondary | ICD-10-CM

## 2016-03-06 DIAGNOSIS — Z23 Encounter for immunization: Secondary | ICD-10-CM

## 2016-03-06 DIAGNOSIS — Z0000 Encounter for general adult medical examination without abnormal findings: Secondary | ICD-10-CM | POA: Diagnosis not present

## 2016-03-06 MED ORDER — LORATADINE 10 MG PO TABS: 10.0000 mg | ORAL_TABLET | Freq: Every day | ORAL | Status: DC

## 2016-03-06 NOTE — Progress Notes (Signed)
Subjective:    Patient ID: Samuel ReiningRandy N Level, male    DOB: 11-Jul-1986, 30 y.o.   MRN: [id number]  HPI 30 year old white male here today for complete physical exam. Past medical history is significant for mood swings/bipolar disorder currently managed with Depakote as a mood stabilizer. He also has a history of hyperlipidemia with an LDL cholesterol greater than 150 last year. His blood pressure is significantly elevated today at 160/80. I verified this myself. He denies any chest pain shortness of breath or dyspnea on exertion. My nurse has recorded his height wrong. He is actually 6 foot 2-1/2 inches. He does report some seasonal allergies but is otherwise doing well Past Medical History  Diagnosis Date  . Allergy     seasonal  . Bipolar 1 disorder (HCC)   . Anemia   . Hypertension   . Seizure disorder (HCC) 1994, 2001  . Dyslipidemia     Per patient report  . H/O syncope     Since teenager. Also vasovagal with blood draws.  . Hyperlipidemia    Past Surgical History  Procedure Laterality Date  . Exercise tolerance test  07/21/2013    [facility]ruce protocol: 9:619min, 10.5 METs; HR from 90 to-171 (87% max predicted); BP 125/84 mmHg --> 159/65 mmHg; no arrhythmia or ectopy, no ischemic ECG changes --NEGATIVE GXT, mildly delayed recovery time  . Cardiac monitor  9/3-10/2 2014    NSR, sinus tachycardia to 104-110 bpm.;  No PAC/PVC or arrhythmias.   Current Outpatient Prescriptions on File Prior to Visit  Medication Sig Dispense Refill  . divalproex (DEPAKOTE ER) 500 MG 24 hr tablet Take 2 tablets (1,000 mg total) by mouth daily. 60 tablet 5  . mometasone (ELOCON) 0.1 % cream Apply 1 application topically daily. 45 g 0  . pantoprazole (PROTONIX) 40 MG tablet Take 1 tablet (40 mg total) by mouth daily. (Patient not taking: Reported on 03/06/2016) 30 tablet 3   No current facility-administered medications on file prior to visit.   Allergies  Allergen Reactions  . Hydrocodone Hives and Nausea And  Vomiting  . Benadryl [Diphenhydramine Hcl] Rash   Social History   Social History  . Marital Status: Legally Separated    Spouse Name: N/A  . Number of Children: N/A  . Years of Education: N/A   Occupational History  . Not on file.   Social History Main Topics  . Smoking status: Never Smoker   . Smokeless tobacco: Never Used  . Alcohol Use: No  . Drug Use: No  . Sexual Activity: Not on file   Other Topics Concern  . Not on file   Social History Narrative   Married father of one. Lives with his wife and daughter as well as his mother-in-law and sister-in-law and nephew. He drinks occasional alcohol, but never smoked or use of tobacco products. Does not exercise because he "does not feel like he has the time her energy.   Family History  Problem Relation Age of Onset  . Hypertension Mother   . Hyperlipidemia Mother   . Hyperlipidemia Father   . Hypertension Father   . Cancer Mother   . Hyperlipidemia Maternal Grandmother   . Hypertension Maternal Grandmother   . Diabetes Maternal Grandmother   . Hyperlipidemia Maternal Grandfather   . Hypertension Maternal Grandfather   . Hypertension Paternal Grandmother   . Hyperlipidemia Paternal Grandmother   . Stroke Maternal Grandfather   . Cancer Paternal Grandmother   . Sudden death Paternal Grandfather   .  Hypertension Paternal Grandfather   . Hyperlipidemia Paternal Grandfather   . Hyperlipidemia Sister   . Hypertension Sister   . Heart disease      Reportedly his father had PCI at age 40, this is not confirmed on the patient's report      Review of Systems  All other systems reviewed and are negative.      Objective:   Physical Exam  Constitutional: He is oriented to person, place, and time. He appears well-developed and well-nourished. No distress.  HENT:  Head: Normocephalic and atraumatic.  Right Ear: External ear normal.  Left Ear: External ear normal.  Nose: Nose normal.  Mouth/Throat: Oropharynx is  clear and moist. No oropharyngeal exudate.  Eyes: Conjunctivae and EOM are normal. Pupils are equal, round, and reactive to light. Right eye exhibits no discharge. Left eye exhibits no discharge. No scleral icterus.  Neck: Normal range of motion. Neck supple. No JVD present. No thyromegaly present.  Cardiovascular: Normal rate, regular rhythm and normal heart sounds.  Exam reveals no gallop and no friction rub.   No murmur heard. Pulmonary/Chest: Effort normal and breath sounds normal. No respiratory distress. He has no wheezes. He has no rales.  Abdominal: Soft. Bowel sounds are normal. He exhibits no distension and no mass. There is no tenderness. There is no rebound and no guarding.  Musculoskeletal: Normal range of motion. He exhibits no edema or tenderness.  Lymphadenopathy:    He has no cervical adenopathy.  Neurological: He is alert and oriented to person, place, and time. He has normal reflexes. He displays normal reflexes. No cranial nerve deficit. He exhibits normal muscle tone. Coordination normal.  Skin: Skin is warm. No rash noted. He is not diaphoretic. No erythema. No pallor.  Psychiatric: He has a normal mood and affect. His behavior is normal. Thought content normal.  Vitals reviewed.         Assessment & Plan:  Routine general medical examination at a health care facility - Plan: CBC with Differential/Platelet, COMPLETE METABOLIC PANEL WITH GFR, Lipid panel  Need for Tdap vaccination - Plan: Tdap vaccine greater than or equal to 7yo IM  Elevated BP - Plan: CBC with Differential/Platelet, COMPLETE METABOLIC PANEL WITH GFR, Lipid panel  Physical exam is significant for elevated blood pressure. The patient states he is checking it at home and is much better than this. His medication for his blood pressure. Instead he would like to check his blood pressure at home over the next 2 weeks and provide me the values. If consistently greater than 140/90, I will start the patient on  losartan. I would like him to return fasting for a CBC, CMP, fasting lipid panel. He has a goal LDL cholesterol less than 562. His tetanus shot was given today.

## 2016-03-13 ENCOUNTER — Other Ambulatory Visit: Payer: Medicaid Other

## 2016-03-13 LAB — CBC WITH DIFFERENTIAL/PLATELET
Basophils Absolute: 0 cells/uL (ref 0–200)
Basophils Relative: 0 %
Eosinophils Absolute: 316 cells/uL (ref 15–500)
Eosinophils Relative: 4 %
HCT: 38.6 % (ref 38.5–50.0)
Hemoglobin: 12.8 g/dL — ABNORMAL LOW (ref 13.0–17.0)
LYMPHS PCT: 28 %
Lymphs Abs: 2212 cells/uL (ref 850–3900)
MCH: 29 pg (ref 27.0–33.0)
MCHC: 33.2 g/dL (ref 32.0–36.0)
MCV: 87.5 fL (ref 80.0–100.0)
MONOS PCT: 10 %
MPV: 11.1 fL (ref 7.5–12.5)
Monocytes Absolute: 790 cells/uL (ref 200–950)
Neutro Abs: 4582 cells/uL (ref 1500–7800)
Neutrophils Relative %: 58 %
PLATELETS: 236 10*3/uL (ref 140–400)
RBC: 4.41 MIL/uL (ref 4.20–5.80)
RDW: 14 % (ref 11.0–15.0)
WBC: 7.9 10*3/uL (ref 3.8–10.8)

## 2016-03-13 LAB — COMPLETE METABOLIC PANEL WITH GFR
ALBUMIN: 4.6 g/dL (ref 3.6–5.1)
ALK PHOS: 104 U/L (ref 40–115)
ALT: 54 U/L — ABNORMAL HIGH (ref 9–46)
AST: 35 U/L (ref 10–40)
BUN: 14 mg/dL (ref 7–25)
CALCIUM: 9.8 mg/dL (ref 8.6–10.3)
CO2: 24 mmol/L (ref 20–31)
Chloride: 105 mmol/L (ref 98–110)
Creat: 0.94 mg/dL (ref 0.60–1.35)
Glucose, Bld: 107 mg/dL — ABNORMAL HIGH (ref 70–99)
POTASSIUM: 4.2 mmol/L (ref 3.5–5.3)
Sodium: 139 mmol/L (ref 135–146)
Total Bilirubin: 0.4 mg/dL (ref 0.2–1.2)
Total Protein: 7.3 g/dL (ref 6.1–8.1)

## 2016-03-13 LAB — LIPID PANEL
CHOL/HDL RATIO: 5.1 ratio — AB (ref <=5.0)
Cholesterol: 223 mg/dL — ABNORMAL HIGH (ref 125–200)
HDL: 44 mg/dL (ref >=40)
LDL Cholesterol: 166 mg/dL — ABNORMAL HIGH (ref <130)
Triglycerides: 67 mg/dL (ref <150)
VLDL: 13 mg/dL (ref <30)

## 2016-03-15 ENCOUNTER — Other Ambulatory Visit: Payer: Self-pay | Admitting: Family Medicine

## 2016-03-15 MED ORDER — ATORVASTATIN CALCIUM 20 MG PO TABS: 20.0000 mg | ORAL_TABLET | Freq: Every day | ORAL | Status: DC

## 2016-07-18 ENCOUNTER — Telehealth: Payer: Self-pay | Admitting: Family Medicine

## 2016-07-18 NOTE — Telephone Encounter (Signed)
Mother calling.  Apparently pt has stitches, she does not say where.  Wants to know how to keep them clean.  Tried to call back.  Left message.  In general keep site clean and dry.  Can light go over area litely with H2O2 or sterile water and then pat dry.  Told her call back if any other questions.

## 2016-07-23 ENCOUNTER — Ambulatory Visit (INDEPENDENT_AMBULATORY_CARE_PROVIDER_SITE_OTHER): Payer: Medicaid Other | Admitting: Family Medicine

## 2016-07-23 ENCOUNTER — Encounter: Payer: Self-pay | Admitting: Family Medicine

## 2016-07-23 VITALS — BP 120/80 | HR 84 | Temp 98.0°F | Resp 18 | Wt 236.0 lb

## 2016-07-23 DIAGNOSIS — F39 Unspecified mood [affective] disorder: Secondary | ICD-10-CM

## 2016-07-23 DIAGNOSIS — Z4802 Encounter for removal of sutures: Secondary | ICD-10-CM

## 2016-07-23 DIAGNOSIS — R4586 Emotional lability: Secondary | ICD-10-CM

## 2016-07-23 MED ORDER — DIVALPROEX SODIUM ER 500 MG PO TB24: 1000.0000 mg | ORAL_TABLET | Freq: Every day | ORAL | 5 refills | Status: DC

## 2016-07-23 NOTE — Progress Notes (Signed)
Subjective:    Patient ID: Samuel Reynolds, male    DOB: [birth year], 28 y.o.   MRN: [id number]  HPI Patient is a 30 year old male who suffered a laceration to his right index finger on September 19. This was sutured closed at an outside hospital with 4 simple interrupted 4-0 Ethilon sutures. He is here today for suture removal. All 4 sutures were removed without difficulty. The wound appears well-healed although there is a flap of dead hard avascular skin standing proud of the surface of the skin. This is hanging on surfaces. Past Medical History:  Diagnosis Date  . Allergy    seasonal  . Anemia   . Bipolar 1 disorder (HCC)   . Dyslipidemia    Per patient report  . H/O syncope    Since teenager. Also vasovagal with blood draws.  . Hyperlipidemia   . Hypertension   . Seizure disorder (HCC) 1994, 2001   Past Surgical History:  Procedure Laterality Date  . Cardiac monitor  9/3-10/2 2014   NSR, sinus tachycardia to 104-110 bpm.;  No PAC/PVC or arrhythmias.  . Exercise tolerance test  07/21/2013   [facility] protocol: 9:54min, 10.5 METs; HR from 90 to-171 (87% max predicted); BP 125/84 mmHg --> 159/65 mmHg; no arrhythmia or ectopy, no ischemic ECG changes --NEGATIVE GXT, mildly delayed recovery time   Current Outpatient Prescriptions on File Prior to Visit  Medication Sig Dispense Refill  . loratadine (CLARITIN) 10 MG tablet Take 1 tablet (10 mg total) by mouth daily. 30 tablet 11  . atorvastatin (LIPITOR) 20 MG tablet Take 1 tablet (20 mg total) by mouth daily. (Patient not taking: Reported on 07/23/2016) 90 tablet 3  . pantoprazole (PROTONIX) 40 MG tablet Take 1 tablet (40 mg total) by mouth daily. (Patient not taking: Reported on 07/23/2016) 30 tablet 3   No current facility-administered medications on file prior to visit.    Allergies  Allergen Reactions  . Hydrocodone Hives and Nausea And Vomiting  . Benadryl [Diphenhydramine Hcl] Rash   Social History   Social History  . Marital  status: Legally Separated    Spouse name: N/A  . Number of children: N/A  . Years of education: N/A   Occupational History  . Not on file.   Social History Main Topics  . Smoking status: Never Smoker  . Smokeless tobacco: Never Used  . Alcohol use No  . Drug use: No  . Sexual activity: Not on file   Other Topics Concern  . Not on file   Social History Narrative   Married father of one. Lives with his wife and daughter as well as his mother-in-law and sister-in-law and nephew. He drinks occasional alcohol, but never smoked or use of tobacco products. Does not exercise because he "does not feel like he has the time her energy.      Review of Systems  All other systems reviewed and are negative.      Objective:   Physical Exam  Cardiovascular: Normal rate and regular rhythm.   Pulmonary/Chest: Effort normal and breath sounds normal.  Vitals reviewed.  See hpi       Assessment & Plan:  Visit for suture removal  Mood swings (HCC) - Plan: divalproex (DEPAKOTE ER) 500 MG 24 hr tablet  4 sutures removed without difficulty. I debrided the hard dead avascular skin with a pair of iris scissors the wound was then covered with Band-Aid. Wound care was discussed. Patient has only been taking Depakote 500 mg by  mouth daily at bedtime. He stopped taking the other tablet in the morning because it made him tired. I explained to the patient that the medicine is an extended release and he should be able to take both capsules at night. I do believe his mood swings would be better controlled here on the thousand milligrams of Depakote today

## 2016-09-11 ENCOUNTER — Ambulatory Visit: Payer: Medicaid Other | Admitting: Family Medicine

## 2017-03-06 ENCOUNTER — Encounter: Payer: Self-pay | Admitting: Family Medicine

## 2017-03-06 ENCOUNTER — Ambulatory Visit (INDEPENDENT_AMBULATORY_CARE_PROVIDER_SITE_OTHER): Payer: Medicaid Other | Admitting: Physician Assistant

## 2017-03-06 ENCOUNTER — Encounter: Payer: Self-pay | Admitting: Physician Assistant

## 2017-03-06 VITALS — BP 124/88 | HR 113 | Temp 97.7°F | Resp 18 | Wt 218.6 lb

## 2017-03-06 DIAGNOSIS — Z0000 Encounter for general adult medical examination without abnormal findings: Secondary | ICD-10-CM | POA: Diagnosis not present

## 2017-03-06 DIAGNOSIS — F319 Bipolar disorder, unspecified: Secondary | ICD-10-CM

## 2017-03-06 DIAGNOSIS — G4700 Insomnia, unspecified: Secondary | ICD-10-CM | POA: Diagnosis not present

## 2017-03-06 DIAGNOSIS — E785 Hyperlipidemia, unspecified: Secondary | ICD-10-CM | POA: Diagnosis not present

## 2017-03-06 LAB — CBC WITH DIFFERENTIAL/PLATELET
Basophils Absolute: 0 cells/uL (ref 0–200)
Basophils Relative: 0 %
EOS PCT: 2 %
Eosinophils Absolute: 164 cells/uL (ref 15–500)
HCT: 41.8 % (ref 38.5–50.0)
Hemoglobin: 14.2 g/dL (ref 13.0–17.0)
LYMPHS ABS: 1886 {cells}/uL (ref 850–3900)
Lymphocytes Relative: 23 %
MCH: 29.5 pg (ref 27.0–33.0)
MCHC: 34 g/dL (ref 32.0–36.0)
MCV: 86.9 fL (ref 80.0–100.0)
MONOS PCT: 9 %
MPV: 10.5 fL (ref 7.5–12.5)
Monocytes Absolute: 738 cells/uL (ref 200–950)
NEUTROS PCT: 66 %
Neutro Abs: 5412 cells/uL (ref 1500–7800)
PLATELETS: 283 10*3/uL (ref 140–400)
RBC: 4.81 MIL/uL (ref 4.20–5.80)
RDW: 13.8 % (ref 11.0–15.0)
WBC: 8.2 10*3/uL (ref 3.8–10.8)

## 2017-03-06 LAB — COMPLETE METABOLIC PANEL WITH GFR
ALBUMIN: 4.8 g/dL (ref 3.6–5.1)
ALT: 23 U/L (ref 9–46)
AST: 17 U/L (ref 10–40)
Alkaline Phosphatase: 91 U/L (ref 40–115)
BUN: 10 mg/dL (ref 7–25)
CHLORIDE: 104 mmol/L (ref 98–110)
CO2: 24 mmol/L (ref 20–31)
Calcium: 10 mg/dL (ref 8.6–10.3)
Creat: 1.05 mg/dL (ref 0.60–1.35)
GFR, Est African American: >89 mL/min (ref >=60)
GFR, Est Non African American: >89 mL/min (ref >=60)
GLUCOSE: 87 mg/dL (ref 70–99)
POTASSIUM: 3.9 mmol/L (ref 3.5–5.3)
SODIUM: 140 mmol/L (ref 135–146)
Total Bilirubin: 0.7 mg/dL (ref 0.2–1.2)
Total Protein: 7.8 g/dL (ref 6.1–8.1)

## 2017-03-06 LAB — LIPID PANEL
Cholesterol: 185 mg/dL (ref <200)
HDL: 38 mg/dL — ABNORMAL LOW (ref >40)
LDL CALC: 136 mg/dL — AB (ref <100)
Total CHOL/HDL Ratio: 4.9 Ratio (ref <5.0)
Triglycerides: 57 mg/dL (ref <150)
VLDL: 11 mg/dL (ref <30)

## 2017-03-06 LAB — TSH: TSH: 0.67 m[IU]/L (ref 0.40–4.50)

## 2017-03-06 MED ORDER — ZOLPIDEM TARTRATE 5 MG PO TABS: 5.0000 mg | ORAL_TABLET | Freq: Every evening | ORAL | 1 refills | Status: DC | PRN

## 2017-03-06 NOTE — Progress Notes (Signed)
Patient ID: Samuel Reynolds MRN: [id number], DOB: [birth year] 31 y.o. Date of Encounter: 03/06/2017, 11:12 AM    Chief Complaint: Physical (CPE)  HPI: 31 y.o. y/o male here for CPE.   I reviewed his note from his physical exam with Dr. Tanya Nones here 03/06/16. Reviewed that at that visit he was getting his blood pressure reading high and patient was had been checking it and was getting good readings--  he was to continue checking it and follow-up of blood pressure elevated. Today they report that he has continue to monitor blood pressure and had been getting good readings. Also reviewed that when he returned for fasting labs LDL came back 166 and he was to start Lipitor 20 mg. Also reviewed that he has a history of "mood swings "/bipolar and that he had been on Depakote but all of these medicines are now marked off of his med list.  Today a male accompanies him for visit. When I started discussing the Lipitor, he states that he took it for about 2 months and that's when she adds that "then he moved and they separated and nobody would take him to get the refills" says that "she is his ex-girlfriend but she is the only one that gives a crap, so here she is."  He reports that he was having no adverse effects from the Lipitor. He reports that he also has been off of the Depakote since that same time as when Lipitor was stopped---off these meds since ~ last July-August..  They report that he has gotten a job. He has been working there for about 1 week now. They both state that he actually seems a lot better since he has been working this job. Says that prior to this job he was staying up all night and sleeping all day. Now even though he feels like he is working hard at this job he still cannot sleep at night. Cannot fall asleep until about 3 AM but then has to get up at 6:15 AM to go to work.  He drank a can of [facility] about 45 minutes ago. Otherwise is fasting. Says that he will not be able  to return fasting because of his work schedule so will go ahead and check labs now even though he has had this [facility].   Review of Systems: Consitutional: No fever, chills, fatigue, night sweats, lymphadenopathy, or weight changes. Eyes: No visual changes, eye redness, or discharge. ENT/Mouth: Ears: No otalgia, tinnitus, hearing loss, discharge. Nose: No congestion, rhinorrhea, sinus pain, or epistaxis. Throat: No sore throat, post nasal drip, or teeth pain. Cardiovascular: No CP, palpitations, diaphoresis, DOE, edema, orthopnea, PND. Respiratory: No cough, hemoptysis, SOB, or wheezing. Gastrointestinal: No anorexia, dysphagia, reflux, pain, nausea, vomiting, hematemesis, diarrhea, constipation, BRBPR, or melena. Genitourinary: No dysuria, frequency, urgency, hematuria, incontinence, nocturia, decreased urinary stream, discharge, impotence, or testicular pain/masses. Musculoskeletal: No decreased ROM, myalgias, stiffness, joint swelling, or weakness. Skin: No rash, erythema, lesion changes, pain, warmth, jaundice, or pruritis. Neurological: No headache, dizziness, syncope, seizures, tremors, memory loss, coordination problems, or paresthesias. Psychological: No anxiety, depression, hallucinations, SI/HI. Endocrine: No fatigue, polydipsia, polyphagia, polyuria, or known diabetes. All other systems were reviewed and are otherwise negative.  Past Medical History:  Diagnosis Date  . Allergy    seasonal  . Anemia   . Bipolar 1 disorder (HCC)   . Dyslipidemia    Per patient report  . H/O syncope    Since teenager. Also vasovagal with blood  draws.  . Hyperlipidemia   . Hypertension   . Seizure disorder (HCC) 1994, 2001     Past Surgical History:  Procedure Laterality Date  . Cardiac monitor  9/3-10/2 2014   NSR, sinus tachycardia to 104-110 bpm.;  No PAC/PVC or arrhythmias.  . Exercise tolerance test  07/21/2013   Bruce protocol: 9:12min, 10.5 METs; HR from 90 to-171 (87% max  predicted); BP 125/84 mmHg --> 159/65 mmHg; no arrhythmia or ectopy, no ischemic ECG changes --NEGATIVE GXT, mildly delayed recovery time    Home Meds:  Outpatient Medications Prior to Visit  Medication Sig Dispense Refill  . atorvastatin (LIPITOR) 20 MG tablet Take 1 tablet (20 mg total) by mouth daily. (Patient not taking: Reported on 07/23/2016) 90 tablet 3  . divalproex (DEPAKOTE ER) 500 MG 24 hr tablet Take 2 tablets (1,000 mg total) by mouth daily. 60 tablet 5  . loratadine (CLARITIN) 10 MG tablet Take 1 tablet (10 mg total) by mouth daily. 30 tablet 11  . pantoprazole (PROTONIX) 40 MG tablet Take 1 tablet (40 mg total) by mouth daily. (Patient not taking: Reported on 07/23/2016) 30 tablet 3   No facility-administered medications prior to visit.     Allergies:  Allergies  Allergen Reactions  . Hydrocodone Hives and Nausea And Vomiting  . Benadryl [Diphenhydramine Hcl] Rash    Social History   Social History  . Marital status: Legally Separated    Spouse name: N/A  . Number of children: N/A  . Years of education: N/A   Occupational History  . Not on file.   Social History Main Topics  . Smoking status: Never Smoker  . Smokeless tobacco: Never Used  . Alcohol use No  . Drug use: No  . Sexual activity: Not on file   Other Topics Concern  . Not on file   Social History Narrative   Married father of one. Lives with his wife and daughter as well as his mother-in-law and sister-in-law and nephew. He drinks occasional alcohol, but never smoked or use of tobacco products. Does not exercise because he "does not feel like he has the time her energy.    Family History  Problem Relation Age of Onset  . Hypertension Mother   . Hyperlipidemia Mother   . Hyperlipidemia Father   . Hypertension Father   . Cancer Mother   . Hyperlipidemia Maternal Grandmother   . Hypertension Maternal Grandmother   . Diabetes Maternal Grandmother   . Hyperlipidemia Maternal Grandfather   .  Hypertension Maternal Grandfather   . Hypertension Paternal Grandmother   . Hyperlipidemia Paternal Grandmother   . Stroke Maternal Grandfather   . Cancer Paternal Grandmother   . Sudden death Paternal Grandfather   . Hypertension Paternal Grandfather   . Hyperlipidemia Paternal Grandfather   . Hyperlipidemia Sister   . Hypertension Sister   . Heart disease Unknown        Reportedly his father had PCI at age 67, this is not confirmed on the patient's report    Physical Exam: Blood pressure 124/88, pulse (!) 113, temperature 97.7 F (36.5 C), temperature source Oral, resp. rate 18, weight 218 lb 9.6 oz (99.2 kg), SpO2 99 %.  General: Well developed, well nourished WM. Appears in no acute distress. HEENT: Normocephalic, atraumatic. Conjunctiva pink, sclera non-icteric. Pupils 2 mm constricting to 1 mm, round, regular, and equally reactive to light and accomodation. EOMI. Internal auditory canal clear. TMs with good cone of light and without pathology. Nasal mucosa pink.  Nares are without discharge. No sinus tenderness. Oral mucosa pink. Pharynx without exudate.   Neck: Supple. Trachea midline. No thyromegaly. Full ROM. No lymphadenopathy. Lungs: Clear to auscultation bilaterally without wheezes, rales, or rhonchi. Breathing is of normal effort and unlabored. Cardiovascular: RRR with S1 S2. No murmurs, rubs, or gallops. Distal pulses 2+ symmetrically. No carotid or abdominal bruits. Abdomen: Soft, non-tender, non-distended with normoactive bowel sounds. No hepatosplenomegaly or masses. No rebound/guarding. No CVA tenderness. No hernias. Musculoskeletal: Full range of motion and 5/5 strength throughout.  Skin: Warm and moist without erythema, ecchymosis, wounds, or rash. Neuro: A+Ox3. CN II-XII grossly intact. Moves all extremities spontaneously. Full sensation throughout. Normal gait.  Psych:  Responds to questions appropriately with a normal affect.   Assessment/Plan:  31 y.o. y/o white  male here for CPE  -1. Encounter for preventive health examination  A. Screening Labs: - CBC with Differential/Platelet - COMPLETE METABOLIC PANEL WITH GFR - Lipid panel - TSH  B. Screening For Prostate Cancer: N/A C. Screening For Colorectal Cancer:  N/A  D. Immunizations: Flu----------------N/A Tetanus---------UpToDate--received Tdap 02/2016 Pneumococcal--N/A Shingles Vaccine--N/A until age 31  2. Hyperlipidemia, unspecified hyperlipidemia type --See HPI - COMPLETE METABOLIC PANEL WITH GFR - Lipid panel  3. Bipolar 1 disorder (HCC) --See HPI. Needs f/u regarding this but at this time he and ex-girlfriend say this is stable and needs no Rx/ defers any Tx  4. Insomnia, unspecified type Told him to take the Ambien at least 9 hours before he needs to wake up for work. Follow-up if this causes any adverse effects. - zolpidem (AMBIEN) 5 MG tablet; Take 1 tablet (5 mg total) by mouth at bedtime as needed for sleep.  Dispense: 30 tablet; Refill: 1     Signed:   [address]Mary Beth CharlestonDixon,PA, [place]  03/06/2017 11:12 AM

## 2017-03-07 ENCOUNTER — Encounter: Payer: Medicaid Other | Admitting: Family Medicine

## 2018-09-05 ENCOUNTER — Ambulatory Visit (INDEPENDENT_AMBULATORY_CARE_PROVIDER_SITE_OTHER): Payer: Self-pay | Admitting: Family Medicine

## 2018-09-05 ENCOUNTER — Encounter: Payer: Self-pay | Admitting: Family Medicine

## 2018-09-05 VITALS — BP 120/72 | HR 83 | Temp 98.2°F | Ht 63.0 in | Wt 218.0 lb

## 2018-09-05 DIAGNOSIS — R112 Nausea with vomiting, unspecified: Secondary | ICD-10-CM

## 2018-09-05 NOTE — Progress Notes (Signed)
Subjective:    Patient ID: Samuel Reynolds, male    DOB: [birth year], 32 y.o.   MRN: [id number]  HPI  Patient states he needs a note for work.  Apparently last Friday he had nausea and vomiting.  He missed work Friday and into Saturday.  He has had no further nausea or vomiting since Saturday.  He denies any abdominal pain.  He denies any nausea.  He denies any vomiting.  He denies any diarrhea.  He denies any fever.  He has been asymptomatic since Sunday.  He denies any cough, sore throat, otalgia, or rhinorrhea.  However he was told he needed a doctor's note before you come back to work Past Medical History:  Diagnosis Date  . Allergy    seasonal  . Anemia   . Bipolar 1 disorder (HCC)   . Dyslipidemia    Per patient report  . H/O syncope    Since teenager. Also vasovagal with blood draws.  . Hyperlipidemia   . Hypertension   . Seizure disorder (HCC) 1994, 2001   Past Surgical History:  Procedure Laterality Date  . Cardiac monitor  9/3-10/2 2014   NSR, sinus tachycardia to 104-110 bpm.;  No PAC/PVC or arrhythmias.  . Exercise tolerance test  07/21/2013   [facility]ruce protocol: 9:63min, 10.5 METs; HR from 90 to-171 (87% max predicted); BP 125/84 mmHg --> 159/65 mmHg; no arrhythmia or ectopy, no ischemic ECG changes --NEGATIVE GXT, mildly delayed recovery time   No current outpatient medications on file prior to visit.   No current facility-administered medications on file prior to visit.    Allergies  Allergen Reactions  . Hydrocodone Hives and Nausea And Vomiting  . Benadryl [Diphenhydramine Hcl] Rash   Social History   Socioeconomic History  . Marital status: Legally Separated    Spouse name: Not on file  . Number of children: Not on file  . Years of education: Not on file  . Highest education level: Not on file  Occupational History  . Not on file  Social Needs  . Financial resource strain: Not on file  . Food insecurity:    Worry: Not on file    Inability: Not on file    . Transportation needs:    Medical: Not on file    Non-medical: Not on file  Tobacco Use  . Smoking status: Never Smoker  . Smokeless tobacco: Never Used  Substance and Sexual Activity  . Alcohol use: No  . Drug use: No  . Sexual activity: Not on file  Lifestyle  . Physical activity:    Days per week: Not on file    Minutes per session: Not on file  . Stress: Not on file  Relationships  . Social connections:    Talks on phone: Not on file    Gets together: Not on file    Attends religious service: Not on file    Active member of club or organization: Not on file    Attends meetings of clubs or organizations: Not on file    Relationship status: Not on file  . Intimate partner violence:    Fear of current or ex partner: Not on file    Emotionally abused: Not on file    Physically abused: Not on file    Forced sexual activity: Not on file  Other Topics Concern  . Not on file  Social History Narrative   Married father of one. Lives with his wife and daughter as well as his  mother-in-law and sister-in-law and nephew. He drinks occasional alcohol, but never smoked or use of tobacco products. Does not exercise because he "does not feel like he has the time her energy.     Review of Systems  All other systems reviewed and are negative.      Objective:   Physical Exam  Constitutional: He appears well-developed and well-nourished.  Non-toxic appearance. He does not appear ill.  HENT:  Head: Normocephalic and atraumatic.  Mouth/Throat: Oropharynx is clear and moist. No oropharyngeal exudate.  Cardiovascular: Normal rate, regular rhythm and normal heart sounds.  Pulmonary/Chest: Effort normal and breath sounds normal. He has no wheezes. He has no rhonchi. He has no rales.  Abdominal: Soft. Normal appearance, normal aorta and bowel sounds are normal. He exhibits no distension. There is no hepatosplenomegaly. There is no tenderness. There is no rigidity and no guarding.  Vitals  reviewed.         Assessment & Plan:  Non-intractable vomiting with nausea, unspecified vomiting type  Patient's physical exam is completely normal today.  He is clinically well and is certainly able to return to work.  I suspect he may have had a 48-hour stomach virus however it is difficult to ascertain what was the cause of his symptoms given the fact he is asymptomatic now.  I see no reason he cannot return to work.

## 2021-01-02 ENCOUNTER — Ambulatory Visit (INDEPENDENT_AMBULATORY_CARE_PROVIDER_SITE_OTHER): Payer: Self-pay | Admitting: Family Medicine

## 2021-01-02 ENCOUNTER — Other Ambulatory Visit: Payer: Self-pay

## 2021-01-02 ENCOUNTER — Encounter: Payer: Self-pay | Admitting: Family Medicine

## 2021-01-02 VITALS — BP 128/76 | HR 99 | Temp 98.1°F | Ht 75.0 in | Wt 259.0 lb

## 2021-01-02 DIAGNOSIS — Z0000 Encounter for general adult medical examination without abnormal findings: Secondary | ICD-10-CM

## 2021-01-02 LAB — COMPLETE METABOLIC PANEL WITH GFR
AG Ratio: 1.4 (calc) (ref 1.0–2.5)
ALT: 35 U/L (ref 9–46)
AST: 23 U/L (ref 10–40)
Albumin: 4.6 g/dL (ref 3.6–5.1)
Alkaline phosphatase (APISO): 99 U/L (ref 36–130)
BUN: 13 mg/dL (ref 7–25)
CO2: 27 mmol/L (ref 20–32)
Calcium: 9.9 mg/dL (ref 8.6–10.3)
Chloride: 102 mmol/L (ref 98–110)
Creat: 1.12 mg/dL (ref 0.60–1.35)
GFR, Est African American: 100 mL/min/{1.73_m2} (ref >=60)
GFR, Est Non African American: 86 mL/min/{1.73_m2} (ref >=60)
Globulin: 3.2 g/dL (calc) (ref 1.9–3.7)
Glucose, Bld: 99 mg/dL (ref 65–99)
Potassium: 4.3 mmol/L (ref 3.5–5.3)
Sodium: 138 mmol/L (ref 135–146)
Total Bilirubin: 0.4 mg/dL (ref 0.2–1.2)
Total Protein: 7.8 g/dL (ref 6.1–8.1)

## 2021-01-02 LAB — CBC WITH DIFFERENTIAL/PLATELET
Absolute Monocytes: 760 cells/uL (ref 200–950)
Basophils Absolute: 56 cells/uL (ref 0–200)
Basophils Relative: 0.7 %
Eosinophils Absolute: 272 cells/uL (ref 15–500)
Eosinophils Relative: 3.4 %
HCT: 40.9 % (ref 38.5–50.0)
Hemoglobin: 13.9 g/dL (ref 13.2–17.1)
Lymphs Abs: 2488 cells/uL (ref 850–3900)
MCH: 29.9 pg (ref 27.0–33.0)
MCHC: 34 g/dL (ref 32.0–36.0)
MCV: 88 fL (ref 80.0–100.0)
MPV: 11.3 fL (ref 7.5–12.5)
Monocytes Relative: 9.5 %
Neutro Abs: 4424 cells/uL (ref 1500–7800)
Neutrophils Relative %: 55.3 %
Platelets: 266 10*3/uL (ref 140–400)
RBC: 4.65 10*6/uL (ref 4.20–5.80)
RDW: 12.7 % (ref 11.0–15.0)
Total Lymphocyte: 31.1 %
WBC: 8 10*3/uL (ref 3.8–10.8)

## 2021-01-02 LAB — LIPID PANEL
Cholesterol: 224 mg/dL — ABNORMAL HIGH (ref <200)
HDL: 39 mg/dL — ABNORMAL LOW (ref >=40)
LDL Cholesterol (Calc): 162 mg/dL (calc) — ABNORMAL HIGH
Non-HDL Cholesterol (Calc): 185 mg/dL (calc) — ABNORMAL HIGH (ref <130)
Total CHOL/HDL Ratio: 5.7 (calc) — ABNORMAL HIGH (ref <5.0)
Triglycerides: 110 mg/dL (ref <150)

## 2021-01-02 MED ORDER — SULFAMETHOXAZOLE-TRIMETHOPRIM 800-160 MG PO TABS: 1.0000 | ORAL_TABLET | Freq: Two times a day (BID) | ORAL | 0 refills | Status: AC

## 2021-01-02 NOTE — Progress Notes (Signed)
Subjective:    Patient ID: Samuel Reynolds, male    DOB: [birth year], 35 y.o.   MRN: [id number]  HPI Patient is a 35 year old Caucasian male here today for complete physical exam.  He has a history of bipolar disorder.  I have not seen the patient in over 2 years.  He states that he is doing relatively well since I last saw him.  He denies any anger control issues or depression or mania.  However he does have an infection on his abdomen.  Just below the umbilicus at the belt line, there is an erythematous patch of skin roughly 4 cm in diameter with a central pustule that has ruptured and is draining.  It appears as though he may have had an insect bite which has become secondarily infected.  There is no fluctuance under the skin that requires incision and drainage however there is apparent secondary cellulitis.  There is also 2 other small insect bites on his abdomen however these are not infected.  He denies any smoking.  He denies any drug use.  His exam today is significant for large tonsils and peritonsillar tissue virtually occluding his airway.  He does report snoring as well as hypersomnolence but he declines a referral for a sleep study. Past Medical History:  Diagnosis Date  . Allergy    seasonal  . Anemia   . Bipolar 1 disorder (HCC)   . Dyslipidemia    Per patient report  . H/O syncope    Since teenager. Also vasovagal with blood draws.  . Hyperlipidemia   . Hypertension   . Seizure disorder (HCC) 1994, 2001   Past Surgical History:  Procedure Laterality Date  . Cardiac monitor  9/3-10/2 2014   NSR, sinus tachycardia to 104-110 bpm.;  No PAC/PVC or arrhythmias.  . Exercise tolerance test  07/21/2013   Bruce protocol: 9:41min, 10.5 METs; HR from 90 to-171 (87% max predicted); BP 125/84 mmHg --> 159/65 mmHg; no arrhythmia or ectopy, no ischemic ECG changes --NEGATIVE GXT, mildly delayed recovery time   No current outpatient medications on file prior to visit.   No current  facility-administered medications on file prior to visit.   Allergies  Allergen Reactions  . Hydrocodone Hives and Nausea And Vomiting  . Benadryl [Diphenhydramine Hcl] Rash   Social History   Socioeconomic History  . Marital status: Legally Separated    Spouse name: Not on file  . Number of children: Not on file  . Years of education: Not on file  . Highest education level: Not on file  Occupational History  . Not on file  Tobacco Use  . Smoking status: Never Smoker  . Smokeless tobacco: Never Used  Substance and Sexual Activity  . Alcohol use: No  . Drug use: No  . Sexual activity: Not on file  Other Topics Concern  . Not on file  Social History Narrative   Married father of one. Lives with his wife and daughter as well as his mother-in-law and sister-in-law and nephew. He drinks occasional alcohol, but never smoked or use of tobacco products. Does not exercise because he "does not feel like he has the time her energy.   Social Determinants of Health   Financial Resource Strain: Not on file  Food Insecurity: Not on file  Transportation Needs: Not on file  Physical Activity: Not on file  Stress: Not on file  Social Connections: Not on file  Intimate Partner Violence: Not on file   Family History  Problem Relation Age of Onset  . Hypertension Mother   . Hyperlipidemia Mother   . Hyperlipidemia Father   . Hypertension Father   . Cancer Mother   . Hyperlipidemia Maternal Grandmother   . Hypertension Maternal Grandmother   . Diabetes Maternal Grandmother   . Hyperlipidemia Maternal Grandfather   . Hypertension Maternal Grandfather   . Hypertension Paternal Grandmother   . Hyperlipidemia Paternal Grandmother   . Stroke Maternal Grandfather   . Cancer Paternal Grandmother   . Sudden death Paternal Grandfather   . Hypertension Paternal Grandfather   . Hyperlipidemia Paternal Grandfather   . Hyperlipidemia Sister   . Hypertension Sister   . Heart disease Other         Reportedly his father had PCI at age 59, this is not confirmed on the patient's report      Review of Systems  All other systems reviewed and are negative.      Objective:   Physical Exam Vitals reviewed.  Constitutional:      General: He is not in acute distress.    Appearance: He is well-developed. He is not diaphoretic.  HENT:     Head: Normocephalic and atraumatic.     Right Ear: External ear normal.     Left Ear: External ear normal.     Nose: Nose normal.     Mouth/Throat:     Pharynx: No oropharyngeal exudate.  Eyes:     General: No scleral icterus.       Right eye: No discharge.        Left eye: No discharge.     Conjunctiva/sclera: Conjunctivae normal.     Pupils: Pupils are equal, round, and reactive to light.  Neck:     Thyroid: No thyromegaly.     Vascular: No JVD.  Cardiovascular:     Rate and Rhythm: Normal rate and regular rhythm.     Heart sounds: Normal heart sounds. No murmur heard. No friction rub. No gallop.   Pulmonary:     Effort: Pulmonary effort is normal. No respiratory distress.     Breath sounds: Normal breath sounds. No wheezing or rales.  Abdominal:     General: Bowel sounds are normal. There is no distension.     Palpations: Abdomen is soft. There is no mass.     Tenderness: There is no abdominal tenderness. There is no guarding or rebound.  Musculoskeletal:        General: No tenderness. Normal range of motion.     Cervical back: Normal range of motion and neck supple.  Lymphadenopathy:     Cervical: No cervical adenopathy.  Skin:    General: Skin is warm.     Coloration: Skin is not pale.     Findings: No erythema or rash.  Neurological:     Mental Status: He is alert and oriented to person, place, and time.     Cranial Nerves: No cranial nerve deficit.     Motor: No abnormal muscle tone.     Coordination: Coordination normal.     Deep Tendon Reflexes: Reflexes are normal and symmetric.  Psychiatric:        Behavior:  Behavior normal.        Thought Content: Thought content normal.           Assessment & Plan:  General medical exam - Plan: CBC with Differential/Platelet, COMPLETE METABOLIC PANEL WITH GFR, Lipid panel  Blood pressure today is good.  I am concerned about his  weight.  I recommended stopping sodas and sweet tea and eating a low carbohydrate low saturated fat diet and trying to increase his exercise to 30 minutes a day 5 days a week.  Also check CBC, CMP, lipid panel.  Patient is due for a flu shot as well as a COVID vaccine but he declines in both.  He is not due for any type of cancer screening.  Begin Bactrim double strength tablets 1 twice daily for 7 days for possible cellulitis on his lower abdomen.  Recheck if not improving in 1 week or sooner if worse

## 2021-01-12 ENCOUNTER — Encounter: Payer: Self-pay | Admitting: *Deleted

## 2021-01-12 MED ORDER — ATORVASTATIN CALCIUM 20 MG PO TABS: 20.0000 mg | ORAL_TABLET | Freq: Every day | ORAL | 3 refills | Status: AC

## 2022-11-02 ENCOUNTER — Other Ambulatory Visit: Payer: Self-pay

## 2022-11-08 ENCOUNTER — Encounter: Payer: Self-pay | Admitting: Family Medicine
